# Patient Record
Sex: Female | Born: 1950
Health system: Southern US, Community
[De-identification: ages and names within clinical notes are randomized; demographics above are authoritative.]

## PROBLEM LIST (undated history)

## (undated) DIAGNOSIS — K219 Gastro-esophageal reflux disease without esophagitis: Secondary | ICD-10-CM

## (undated) DIAGNOSIS — K5900 Constipation, unspecified: Secondary | ICD-10-CM

## (undated) DIAGNOSIS — I509 Heart failure, unspecified: Secondary | ICD-10-CM

## (undated) NOTE — *Deleted (*Deleted)
***In Progress*** PCP: Hoy Register, MD HF Cardiology: Dr. Shirlee Latch  HPI:  52 y.o. with chronic systolic CHF was referred by Dr. [name] for evaluation of CHF.  Patient had no known cardiac history prior to 3/21.  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  Several weeks prior to 3/21 admission, she began to note exertional dyspnea.  This worsened to shortness of breath with minimal exertion and severe orthopnea.  She was admitted in 3/21 with dyspnea. CXR showed pulmonary edema and echo showed EF <20% with moderate LV dilation.  Low BP limited medication titration.  RHC/LHC showed no coronary disease, preserved cardiac output.  She was discharged home.    She recently returned for followup of CHF with Dr. Shirlee Latch on 06/06/20.  She was able to walk a mile without dyspnea.  Reported mild lightheadedness right after she took medications, but this was not something that bothered her.  Still seemed to have some orthopnea and kept the head of bed raised.  Weight was up several lbs.  No chest pain.  Today she returns to HF clinic for pharmacist medication titration. At last visit with MD, furosemide was decreased to 40 mg daily and Entresto was increased to 49/51 mg BID. Since then, she broke out into hives with the increased dose of Entresto, so she was instructed to hold for 3 days and to decrease back to 24/26 mg BID strength.  Won't check BMET since she was stable on the lower dose - Decreased Entresto back to 24/26 but kept furosemide 40/d > assess volume - Was on Corlanor before but was stopped to incr metop (HR 54)  Overall feeling ***. Dizziness, lightheadedness, fatigue:  Chest pain or palpitations:  How is your breathing?: *** SOB: Able to complete all ADLs. Activity level ***  Weight at home pounds. Takes furosemide/torsemide/bumex *** mg *** daily.  LEE PND/Orthopnea  Appetite *** Low-salt diet:   Physical Exam Cost/affordability of  meds   . Shortness of breath/dyspnea on exertion? {YES [id number]  . Orthopnea/PND? {YES [id number] . Edema? {YES [id number] . Lightheadedness/dizziness? {YES [id number] . Daily weights at home? {YES [id number] . Blood pressure/heart rate monitoring at home? {YES [id number] . Following low-sodium/fluid-restricted diet? {YES NO:22349}  HF Medications: Metoprolol succinate 25 mg AM/50 mg PM Entresto 24/26 mg BID Spironolactone 25 mg daily Farxiga (dapagliflozin) 10 mg daily Furosemide 40 mg daily Potassium Chloride 10 mEq daily  Has the patient been experiencing any side effects to the medications prescribed?  {YES NO:22349}  Does the patient have any problems obtaining medications due to transportation or finances?   No - Receives PANF [employer] PAP for [employer] of regimen: {excellent/good/fair/poor:19665} Understanding of indications: {excellent/good/fair/poor:19665} Potential of compliance: {excellent/good/fair/poor:19665} Patient understands to avoid NSAIDs. Patient understands to avoid decongestants.    Pertinent Lab Values: . Serum creatinine ***, BUN ***, Potassium ***, Sodium ***, BNP ***, Magnesium ***, Digoxin ***   Vital Signs: . Weight: *** (last clinic weight: ***) . Blood pressure: ***  . Heart rate: ***   Assessment: 1. Chronic systolic CHF: Nonischemic cardiomyopathy.  Echo in 3/21 with EF < 20%, moderate LV dilation.  LHC/RHC in 4/21 with no CAD, preserved cardiac output.  Etiology is uncertain, she does not use ETOH/drugs, no definite family history. Possible viral myocarditis. Medication titration has been limited by low BP and lightheadedness though this seems improved.  - NYHA class II symptoms; volume status looks ok. - Continue*** furosemide ***40 mg daily -  Continue metoprolol succinate 25 mg AM/50 mg PM - Continue Entresto 24/26 mg BID - Continue spironolactone 25 mg daily - Previously planned to get cardiac MRI to assess for  infiltrative disease/myocarditis (still need to schedule).   - If EF remains < 35%, will need to consider ICD. QRS not wide, not CRT candidate.   2. Pericardial effusion: Small-moderate pericardial effusion on 3/21 echo, no tamponade.  Will reassess on cardiac MRI.    Plan: 1) Medication changes: Based on clinical presentation, vital signs and recent labs will *** 2) Labs: *** 3) Follow-up: ***   Karle Plumber, PharmD, BCPS, BCCP, CPP Heart Failure Clinic Pharmacist [phone]

---

## 2004-12-29 ENCOUNTER — Emergency Department (HOSPITAL_COMMUNITY): Admission: EM | Admit: 2004-12-29 | Discharge: 2004-12-30 | Payer: Self-pay | Admitting: Emergency Medicine

## 2006-08-19 ENCOUNTER — Emergency Department (HOSPITAL_COMMUNITY): Admission: EM | Admit: 2006-08-19 | Discharge: 2006-08-19 | Payer: Self-pay | Admitting: Emergency Medicine

## 2007-09-16 ENCOUNTER — Emergency Department (HOSPITAL_COMMUNITY): Admission: EM | Admit: 2007-09-16 | Discharge: 2007-09-16 | Payer: Self-pay | Admitting: Emergency Medicine

## 2007-12-23 ENCOUNTER — Encounter: Admission: RE | Admit: 2007-12-23 | Discharge: 2008-03-16 | Payer: Self-pay | Admitting: Orthopedic Surgery

## 2009-04-08 ENCOUNTER — Emergency Department (HOSPITAL_COMMUNITY): Admission: EM | Admit: 2009-04-08 | Discharge: 2009-04-08 | Payer: Self-pay | Admitting: Emergency Medicine

## 2011-09-28 ENCOUNTER — Emergency Department (HOSPITAL_COMMUNITY): Payer: Self-pay

## 2011-09-28 ENCOUNTER — Emergency Department (HOSPITAL_COMMUNITY)
Admission: EM | Admit: 2011-09-28 | Discharge: 2011-09-28 | Disposition: A | Discharge status: Home / Self Care | Payer: Self-pay | Attending: Emergency Medicine | Admitting: Emergency Medicine

## 2011-09-28 ENCOUNTER — Encounter (HOSPITAL_COMMUNITY): Payer: Self-pay | Admitting: Emergency Medicine

## 2011-09-28 ENCOUNTER — Other Ambulatory Visit: Payer: Self-pay

## 2011-09-28 DIAGNOSIS — X58XXXA Exposure to other specified factors, initial encounter: Secondary | ICD-10-CM | POA: Insufficient documentation

## 2011-09-28 DIAGNOSIS — R0789 Other chest pain: Secondary | ICD-10-CM

## 2011-09-28 DIAGNOSIS — IMO0002 Reserved for concepts with insufficient information to code with codable children: Secondary | ICD-10-CM | POA: Insufficient documentation

## 2011-09-28 DIAGNOSIS — R110 Nausea: Secondary | ICD-10-CM | POA: Insufficient documentation

## 2011-09-28 DIAGNOSIS — S86919A Strain of unspecified muscle(s) and tendon(s) at lower leg level, unspecified leg, initial encounter: Secondary | ICD-10-CM

## 2011-09-28 DIAGNOSIS — M79609 Pain in unspecified limb: Secondary | ICD-10-CM | POA: Insufficient documentation

## 2011-09-28 DIAGNOSIS — R079 Chest pain, unspecified: Secondary | ICD-10-CM | POA: Insufficient documentation

## 2011-09-28 LAB — CBC
HCT: 34.4 % — ABNORMAL LOW (ref 36.0–46.0)
MCV: 88.4 fL (ref 78.0–100.0)
Platelets: 298 10*3/uL (ref 150–400)
RDW: 14.7 % (ref 11.5–15.5)
WBC: 6 10*3/uL (ref 4.0–10.5)

## 2011-09-28 LAB — D-DIMER, QUANTITATIVE: D-Dimer, Quant: 0.34 ug/mL-FEU (ref 0.00–0.48)

## 2011-09-28 LAB — BASIC METABOLIC PANEL
Calcium: 9.4 mg/dL (ref 8.4–10.5)
Chloride: 104 mEq/L (ref 96–112)
Creatinine, Ser: 0.85 mg/dL (ref 0.50–1.10)
GFR calc non Af Amer: 73 mL/min — ABNORMAL LOW (ref >90)
Sodium: 139 mEq/L (ref 135–145)

## 2011-09-28 LAB — DIFFERENTIAL
Basophils Absolute: 0 10*3/uL (ref 0.0–0.1)
Lymphs Abs: 2.8 10*3/uL (ref 0.7–4.0)
Neutrophils Relative %: 44 % (ref 43–77)

## 2011-09-28 LAB — POCT I-STAT, CHEM 8
Chloride: 107 mEq/L (ref 96–112)
Potassium: 3.8 mEq/L (ref 3.5–5.1)

## 2011-09-28 LAB — POCT I-STAT TROPONIN I: Troponin i, poc: 0 ng/mL (ref 0.00–0.08)

## 2011-09-28 MED ORDER — ASPIRIN 81 MG PO CHEW
324.0000 mg | CHEWABLE_TABLET | Freq: Once | ORAL | Status: AC
  Administered 2011-09-28: 324 mg via ORAL
  Filled 2011-09-28: qty 4

## 2011-09-28 MED ORDER — IBUPROFEN 400 MG PO TABS: 400.0000 mg | ORAL_TABLET | Freq: Three times a day (TID) | ORAL | Status: AC | PRN

## 2011-09-28 MED ORDER — IOHEXOL 350 MG/ML SOLN
100.0000 mL | Freq: Once | INTRAVENOUS | Status: AC | PRN
  Administered 2011-09-28: 100 mL via INTRAVENOUS

## 2011-09-28 MED ORDER — SODIUM CHLORIDE 0.9 % IV SOLN
INTRAVENOUS | Status: DC
  Administered 2011-09-28: 02:00:00 via INTRAVENOUS

## 2011-09-28 NOTE — ED Notes (Signed)
Patient transported to CT 

## 2011-09-28 NOTE — ED Notes (Signed)
PT. REPORTS RIGHT SIDE CHEST PAIN FOR 2 DAYS WITH SLIGHT NAUSEA TODAY , DENIES SOB OR DIAPHORESIS.

## 2011-09-28 NOTE — ED Provider Notes (Addendum)
History     CSN: [id number]  Arrival date & time 09/28/11  0054   First MD Initiated Contact with Patient 09/28/11 0128      Chief Complaint  Patient presents with  . Chest Pain    (Consider location/radiation/quality/duration/timing/severity/associated sxs/prior treatment) HPI Comments: The patient is a 61 year old female with no significant past medical history, a nonsmoker, not on hormone replacement therapy, with no prior thromboembolism, DVT or PE. She presents for evaluation of 2 days of right-sided chest discomfort which she is unable to describe the character of but rates it at a 3-4/10 in intensity, at 9/10 in intensity at its worst, without shortness of breath, diaphoresis, or vomiting, but some mild and fleeting nausea. The chest pain is nonradiating. The patient also complains of right leg pain at the posterior lower leg behind the knee and calf. She reports that this discomfort has been present for 2 days as well. She denies any palpitations, syncope, or near-syncope. She denies fever, chills, cough, or recent illness. She appears to be in no distress at present.  The history is provided by the patient.    History reviewed. No pertinent past medical history.  History reviewed. No pertinent past surgical history.  No family history on file.  History  Substance Use Topics  . Smoking status: Never Smoker   . Smokeless tobacco: Not on file  . Alcohol Use: No    OB History    Grav Para Term Preterm Abortions TAB SAB Ect Mult Living                  Review of Systems  Constitutional: Negative for fever, chills, diaphoresis and fatigue.  HENT: Negative for hearing loss, ear pain, congestion, sore throat, facial swelling, rhinorrhea, trouble swallowing, neck pain, neck stiffness, dental problem, postnasal drip, tinnitus and ear discharge.   Eyes: Negative for photophobia, pain, redness and visual disturbance.  Respiratory: Negative for apnea, cough, chest tightness,  shortness of breath and wheezing.   Cardiovascular: Positive for chest pain. Negative for palpitations and leg swelling.  Gastrointestinal: Negative for nausea, vomiting, abdominal pain, diarrhea and blood in stool.  Genitourinary: Negative.   Musculoskeletal: Negative for myalgias, back pain, joint swelling, arthralgias and gait problem.  Skin: Negative for color change, pallor, rash and wound.  Neurological: Negative for dizziness, tremors, seizures, syncope, facial asymmetry, speech difficulty, weakness, light-headedness, numbness and headaches.  Hematological: Negative for adenopathy. Does not bruise/bleed easily.  Psychiatric/Behavioral: Negative.     Allergies  Review of patient's allergies indicates no known allergies.  Home Medications   Current Outpatient Rx  Name Route Sig Dispense Refill  . ASPIRIN 325 MG PO TBEC Oral Take 325 mg by mouth daily.      BP 131/86  Pulse 88  Temp(Src) 98.9 F (37.2 C) (Oral)  Resp 20  SpO2 96%  Physical Exam  Nursing note and vitals reviewed. Constitutional: She is oriented to person, place, and time. She appears well-developed and well-nourished. No distress.  HENT:  Head: Normocephalic and atraumatic.  Mouth/Throat: Oropharynx is clear and moist.  Eyes: EOM are normal. Pupils are equal, round, and reactive to light.  Neck: Normal range of motion. Neck supple. No JVD present. No tracheal deviation present.  Cardiovascular: Normal rate, regular rhythm, normal heart sounds and intact distal pulses.   No extrasystoles are present. Exam reveals no gallop, no S3, no S4 and no friction rub.   No murmur heard. Pulmonary/Chest: Breath sounds normal. No accessory muscle usage or stridor.  Not tachypneic. No respiratory distress. She has no wheezes. She has no rales. She exhibits no tenderness.  Abdominal: Soft. Bowel sounds are normal. She exhibits no distension. There is no tenderness.  Musculoskeletal: Normal range of motion. She exhibits no  edema and no tenderness.       The patient has no swelling or palpable cord about the right lower extremity, and is not tender to palpation at the posterior calf or behind the right knee where she reports her discomfort.  Neurological: She is alert and oriented to person, place, and time. She has normal reflexes. No cranial nerve deficit. Coordination normal.  Skin: Skin is warm and dry. No rash noted. She is not diaphoretic. No erythema. No pallor.  Psychiatric: She has a normal mood and affect. Her behavior is normal. Judgment and thought content normal.    ED Course  Procedures (including critical care time)  Labs Reviewed  CBC - Abnormal; Notable for the following:    Hemoglobin 11.2 (*)    HCT 34.4 (*)    All other components within normal limits  BASIC METABOLIC PANEL  I-STAT TROPONIN I  D-DIMER, QUANTITATIVE  DIFFERENTIAL  I-STAT TROPONIN I   Dg Chest 2 View  09/28/2011  *RADIOLOGY REPORT*  Clinical Data: Chest pain and left arm pain; right lower leg pain.  CHEST - 2 VIEW  Comparison: Chest radiograph performed 12/30/2004  Findings: The lungs are well-aerated and clear.  There is no evidence of focal opacification, pleural effusion or pneumothorax. Pulmonary vascularity is at the upper limits of normal.  The heart is normal in size; the mediastinal contour is within normal limits.  No acute osseous abnormalities are seen.  Lateral osteophytes are noted along the thoracic spine.  IMPRESSION: No acute cardiopulmonary process seen.  Original Report Authenticated By: Tonia Ghent, M.D.     No diagnosis found.    MDM  Musculoskeletal chest pain, costochondritis, GERD, Gastrointestinal Chest Pain, Pleuritic Chest Pain, Pneumonia, Pneumothorax, Pulmonary Embolism, Esophageal Spasm, Arrhythmia, DVT considered among other potential etiologies in the patient's differential diagnosis. The patient is at low risk for DVT or pulmonary embolism and is a candidate for a rule out of this  diagnosis using a d-dimer if it is negative. Otherwise, she will need evaluation with CT angiogram of the chest as well as lower extremity Doppler ultrasound on the right lower extremity to exclude thromboembolic phenomenon. It is not my impression that this symptomatic complex represents ACS, or acute myocardial infarction as it would be atypical, however I will assess an EKG and a single troponin, as a single troponin, if negative with symptoms ongoing for 2 days, would exclude myocardial ischemia or injury. Chest x-ray appears to show no acute abnormality.    2:57 AM D-dimer is negative making the patient at very low risk of DVT or PE. However, based on my clinical suspicion, I will obtain a CT angio chest to fully exclude the diagnosis of PE.   Felisa Bonier, MD 09/28/11 0144  Felisa Bonier, MD 09/28/11 1610  Felisa Bonier, MD 09/28/11 0300  Felisa Bonier, MD 09/28/11 [phone]

## 2011-09-28 NOTE — ED Notes (Signed)
Family at bedside. 

## 2011-09-28 NOTE — ED Notes (Signed)
Reports interm substernal cp x5 days; presents tonight for same as well as RLE pain just distal to gap of rgt knee; states recent 2 hour trip one way on 3 separate occasions

## 2013-10-09 ENCOUNTER — Emergency Department (HOSPITAL_COMMUNITY)

## 2013-10-09 ENCOUNTER — Emergency Department (HOSPITAL_COMMUNITY)
Admission: EM | Admit: 2013-10-09 | Discharge: 2013-10-09 | Disposition: A | Discharge status: Home / Self Care | Attending: Emergency Medicine | Admitting: Emergency Medicine

## 2013-10-09 ENCOUNTER — Encounter (HOSPITAL_COMMUNITY): Payer: Self-pay | Admitting: Emergency Medicine

## 2013-10-09 DIAGNOSIS — M25439 Effusion, unspecified wrist: Secondary | ICD-10-CM | POA: Diagnosis not present

## 2013-10-09 DIAGNOSIS — Z7982 Long term (current) use of aspirin: Secondary | ICD-10-CM | POA: Diagnosis not present

## 2013-10-09 DIAGNOSIS — M25539 Pain in unspecified wrist: Secondary | ICD-10-CM | POA: Insufficient documentation

## 2013-10-09 DIAGNOSIS — Z87828 Personal history of other (healed) physical injury and trauma: Secondary | ICD-10-CM | POA: Insufficient documentation

## 2013-10-09 DIAGNOSIS — M1990 Unspecified osteoarthritis, unspecified site: Secondary | ICD-10-CM

## 2013-10-09 DIAGNOSIS — M25531 Pain in right wrist: Secondary | ICD-10-CM

## 2013-10-09 MED ORDER — NAPROXEN 250 MG PO TABS
500.0000 mg | ORAL_TABLET | Freq: Once | ORAL | Status: AC
  Administered 2013-10-09: 500 mg via ORAL
  Filled 2013-10-09: qty 2

## 2013-10-09 MED ORDER — NAPROXEN 500 MG PO TABS: 500.0000 mg | ORAL_TABLET | Freq: Two times a day (BID) | ORAL | Status: DC

## 2013-10-09 NOTE — ED Provider Notes (Signed)
Medical screening examination/treatment/procedure(s) were performed by non-physician practitioner and as supervising physician I was immediately available for consultation/collaboration.  EKG Interpretation   None         Robin Patel N Orlandria Kissner, DO 10/09/13 1453 
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
## 2013-10-09 NOTE — Discharge Instructions (Signed)
Take naproxen as directed.  Osteoarthritis Osteoarthritis is a disease that causes soreness and swelling (inflammation) of a joint. It occurs when the cartilage at the affected joint wears down. Cartilage acts as a cushion, covering the ends of bones where they meet to form a joint. Osteoarthritis is the most common form of arthritis. It often occurs in older people. The joints affected most often by this condition include those in the:  Ends of the fingers.  Thumbs.  Neck.  Lower back.  Knees.  Hips. CAUSES  Over time, the cartilage that covers the ends of bones begins to wear away. This causes bone to rub on bone, producing pain and stiffness in the affected joints.  RISK FACTORS Certain factors can increase your chances of having osteoarthritis, including:  Older age.  Excessive body weight.  Overuse of joints. SIGNS AND SYMPTOMS   Pain, swelling, and stiffness in the joint.  Over time, the joint may lose its normal shape.  Small deposits of bone (osteophytes) may grow on the edges of the joint.  Bits of bone or cartilage can break off and float inside the joint space. This may cause more pain and damage. DIAGNOSIS  Your health care provider will do a physical exam and ask about your symptoms. Various tests may be ordered, such as:  X-rays of the affected joint.  An MRI scan.  Blood tests to rule out other types of arthritis.  Joint fluid tests. This involves using a needle to draw fluid from the joint and examining the fluid under a microscope. TREATMENT  Goals of treatment are to control pain and improve joint function. Treatment plans may include:  A prescribed exercise program that allows for rest and joint relief.  A weight control plan.  Pain relief techniques, such as:  Properly applied heat and cold.  Electric pulses delivered to nerve endings under the skin (transcutaneous electrical nerve stimulation, TENS).  Massage.  Certain nutritional  supplements.  Medicines to control pain, such as:  Acetaminophen.  Nonsteroidal anti-inflammatory drugs (NSAIDs), such as naproxen.  Narcotic or central-acting agents, such as tramadol.  Corticosteroids. These can be given orally or as an injection.  Surgery to reposition the bones and relieve pain (osteotomy) or to remove loose pieces of bone and cartilage. Joint replacement may be needed in advanced states of osteoarthritis. HOME CARE INSTRUCTIONS   Only take over-the-counter or prescription medicines as directed by your health care provider. Take all medicines exactly as instructed.  Maintain a healthy weight. Follow your health care provider's instructions for weight control. This may include dietary instructions.  Exercise as directed. Your health care provider can recommend specific types of exercise. These may include:  Strengthening exercises These are done to strengthen the muscles that support joints affected by arthritis. They can be performed with weights or with exercise bands to add resistance.  Aerobic activities These are exercises, such as brisk walking or low-impact aerobics, that get your heart pumping.  Range-of-motion activities These keep your joints limber.  Balance and agility exercises These help you maintain daily living skills.  Rest your affected joints as directed by your health care provider.  Follow up with your health care provider as directed. SEEK MEDICAL CARE IF:   Your skin turns red.  You develop a rash in addition to your joint pain.  You have worsening joint pain. SEEK IMMEDIATE MEDICAL CARE IF:  You have a significant loss of weight or appetite.  You have a fever along with joint or  muscle aches.  You have night sweats. FOR MORE INFORMATION  National Institute of Arthritis and Musculoskeletal and Skin Diseases: www.niams.http://www.myers.net/ [employer] on Aging: https://walker.com/ American College of Rheumatology:  www.rheumatology.org Document Released: 08/20/2005 Document Revised: 06/10/2013 Document Reviewed: 04/27/2013 [facility] Patient Information 2014 [place], [place].  Wrist Pain Wrist injuries are frequent in adults and children. A sprain is an injury to the ligaments that hold your bones together. A strain is an injury to muscle or muscle cord-like structures (tendons) from stretching or pulling. Generally, when wrists are moderately tender to touch following a fall or injury, a break in the bone (fracture) may be present. Most wrist sprains or strains are better in 3 to 5 days, but complete healing may take several weeks. HOME CARE INSTRUCTIONS   Put ice on the injured area.  Put ice in a plastic bag.  Place a towel between your skin and the bag.  Leave the ice on for 15-20 minutes, 03-04 times a day, for the first 2 days.  Keep your arm raised above the level of your heart whenever possible to reduce swelling and pain.  Rest the injured area for at least 48 hours or as directed by your caregiver.  If a splint or elastic bandage has been applied, use it for as long as directed by your caregiver or until seen by a caregiver for a follow-up exam.  Only take over-the-counter or prescription medicines for pain, discomfort, or fever as directed by your caregiver.  Keep all follow-up appointments. You may need to follow up with a specialist or have follow-up X-rays. Improvement in pain level is not a guarantee that you did not fracture a bone in your wrist. The only way to determine whether or not you have a broken bone is by X-ray. SEEK IMMEDIATE MEDICAL CARE IF:   Your fingers are swollen, very red, white, or cold and blue.  Your fingers are numb or tingling.  You have increasing pain.  You have difficulty moving your fingers. MAKE SURE YOU:   Understand these instructions.  Will watch your condition.  Will get help right away if you are not doing well or get worse. Document  Released: 05/30/2005 Document Revised: 11/12/2011 Document Reviewed: 10/11/2010 [facility] Patient Information 2014 [employer], [place].

## 2013-10-09 NOTE — ED Notes (Signed)
C/o right wrist pain and swelling x 2 weeks. No known injury. States it "hurts to lift".

## 2017-10-07 ENCOUNTER — Encounter (HOSPITAL_COMMUNITY): Payer: Self-pay | Admitting: Emergency Medicine

## 2017-10-07 ENCOUNTER — Other Ambulatory Visit: Payer: Self-pay

## 2017-10-07 DIAGNOSIS — Z79899 Other long term (current) drug therapy: Secondary | ICD-10-CM | POA: Insufficient documentation

## 2017-10-07 DIAGNOSIS — R110 Nausea: Secondary | ICD-10-CM | POA: Diagnosis not present

## 2017-10-07 DIAGNOSIS — Z7982 Long term (current) use of aspirin: Secondary | ICD-10-CM | POA: Insufficient documentation

## 2017-10-07 LAB — COMPREHENSIVE METABOLIC PANEL
ALBUMIN: 3.3 g/dL — AB (ref 3.5–5.0)
ALK PHOS: 81 U/L (ref 38–126)
ALT: 24 U/L (ref 14–54)
AST: 21 U/L (ref 15–41)
Anion gap: 11 (ref 5–15)
BUN: 15 mg/dL (ref 6–20)
CALCIUM: 9 mg/dL (ref 8.9–10.3)
CO2: 24 mmol/L (ref 22–32)
CREATININE: 0.59 mg/dL (ref 0.44–1.00)
Chloride: 104 mmol/L (ref 101–111)
GFR calc Af Amer: >60 mL/min (ref >60)
GFR calc non Af Amer: >60 mL/min (ref >60)
GLUCOSE: 98 mg/dL (ref 65–99)
Potassium: 3.8 mmol/L (ref 3.5–5.1)
SODIUM: 139 mmol/L (ref 135–145)
Total Bilirubin: 0.4 mg/dL (ref 0.3–1.2)
Total Protein: 7.7 g/dL (ref 6.5–8.1)

## 2017-10-07 LAB — CBC
HCT: 36 % (ref 36.0–46.0)
Hemoglobin: 11.9 g/dL — ABNORMAL LOW (ref 12.0–15.0)
MCH: 30 pg (ref 26.0–34.0)
MCHC: 33.1 g/dL (ref 30.0–36.0)
MCV: 90.7 fL (ref 78.0–100.0)
PLATELETS: 333 10*3/uL (ref 150–400)
RBC: 3.97 MIL/uL (ref 3.87–5.11)
RDW: 14.9 % (ref 11.5–15.5)
WBC: 4.7 10*3/uL (ref 4.0–10.5)

## 2017-10-07 LAB — LIPASE, BLOOD: Lipase: 28 U/L (ref 11–51)

## 2017-10-07 MED ORDER — ONDANSETRON 4 MG PO TBDP
ORAL_TABLET | ORAL | Status: AC
  Filled 2017-10-07: qty 1

## 2017-10-07 MED ORDER — ONDANSETRON 4 MG PO TBDP
4.0000 mg | ORAL_TABLET | Freq: Once | ORAL | Status: AC
  Administered 2017-10-07: 4 mg via ORAL

## 2017-10-07 NOTE — ED Triage Notes (Signed)
Pt states she got some food from [employer] [place] 4 days ago and she is been nauseated since then no able to eat nothing. Pt denies any vomiting states she is on Mirolax since then trying to get better, but not relief.

## 2017-10-08 ENCOUNTER — Emergency Department (HOSPITAL_COMMUNITY)
Admission: EM | Admit: 2017-10-08 | Discharge: 2017-10-08 | Disposition: A | Discharge status: Home / Self Care | Payer: Medicare Other | Attending: Emergency Medicine | Admitting: Emergency Medicine

## 2017-10-08 DIAGNOSIS — R110 Nausea: Secondary | ICD-10-CM

## 2017-10-08 MED ORDER — ONDANSETRON 4 MG PO TBDP: 4.0000 mg | ORAL_TABLET | Freq: Three times a day (TID) | ORAL | 0 refills | Status: DC | PRN

## 2017-10-08 NOTE — ED Provider Notes (Signed)
[facility] EMERGENCY DEPARTMENT Provider Note   CSN: [id number] Arrival date & time: 10/07/17  1854     History   Chief Complaint Chief Complaint  Patient presents with  . Nausea    HPI Donnamae Kepler is a 67 y.o. female without significant PMHx, presenting to ED with 4 days of intermittent nausea. Pt states she ate [employer] 4 days ago, and began having left sided abdominal pain at the time with nausea. States abdominal pain resolved, however nausea has persisted and caused decreased appetite. She has been treating sx with miralax without relief. Denies vomiting, D/C, F/C, urinary sx, or other complaints. States zofran given in triage significantly improved sx.  The history is provided by the patient.    History reviewed. No pertinent past medical history.  There are no active problems to display for this patient.   History reviewed. No pertinent surgical history.  OB History    No data available       Home Medications    Prior to Admission medications   Medication Sig Start Date End Date Taking? Authorizing Provider  aspirin 325 MG EC tablet Take 325 mg by mouth daily.    [provider]  naproxen (NAPROSYN) 500 MG tablet Take 1 tablet (500 mg total) by mouth 2 (two) times daily. 10/09/13   Hess, Nada Boozer, PA-C  naproxen sodium (ANAPROX) 220 MG tablet Take 220 mg by mouth 2 (two) times daily as needed (pain).    [provider]  ondansetron (ZOFRAN ODT) 4 MG disintegrating tablet Take 1 tablet (4 mg total) by mouth every 8 (eight) hours as needed for nausea or vomiting. 10/08/17   Karmel Patricelli, [place] N, PA-C    Family History No family history on file.  Social History Social History   Tobacco Use  . Smoking status: Never Smoker  . Smokeless tobacco: Never Used  Substance Use Topics  . Alcohol use: No  . Drug use: No     Allergies   Patient has no known allergies.   Review of Systems Review of Systems  Constitutional:  Positive for appetite change. Negative for chills and fever.  Gastrointestinal: Positive for abdominal pain (resolved) and nausea. Negative for constipation, diarrhea and vomiting.  Genitourinary: Negative for dysuria and frequency.  All other systems reviewed and are negative.    Physical Exam Updated Vital Signs BP 137/78   Pulse 78   Temp 98 F (36.7 C) (Oral)   Resp 18   Ht 5\' 3"  (1.6 m)   Wt 65.8 kg (145 lb)   SpO2 99%   BMI 25.69 kg/m   Physical Exam  Constitutional: She appears well-developed and well-nourished. No distress.  HENT:  Head: Normocephalic and atraumatic.  Mouth/Throat: Oropharynx is clear and moist.  Eyes: Conjunctivae are normal.  Cardiovascular: Normal rate, regular rhythm, normal heart sounds and intact distal pulses.  Pulmonary/Chest: Effort normal and breath sounds normal. No respiratory distress.  Abdominal: Soft. Bowel sounds are normal. She exhibits no distension and no mass. There is no tenderness. There is no rebound and no guarding.  Neurological: She is alert.  Skin: Skin is warm.  Psychiatric: She has a normal mood and affect. Her behavior is normal.  Nursing note and vitals reviewed.    ED Treatments / Results  Labs (all labs ordered are listed, but only abnormal results are displayed) Labs Reviewed  COMPREHENSIVE METABOLIC PANEL - Abnormal; Notable for the following components:      Result Value  Albumin 3.3 (*)    All other components within normal limits  CBC - Abnormal; Notable for the following components:   Hemoglobin 11.9 (*)    All other components within normal limits  LIPASE, BLOOD  URINALYSIS, ROUTINE W REFLEX MICROSCOPIC    EKG  EKG Interpretation None       Radiology No results found.  Procedures Procedures (including critical care time)  Medications Ordered in ED Medications  ondansetron (ZOFRAN-ODT) disintegrating tablet 4 mg (4 mg Oral Given 10/07/17 2016)     Initial Impression / Assessment and  Plan / ED Course  I have reviewed the triage vital signs and the nursing notes.  Pertinent labs & imaging results that were available during my care of the patient were reviewed by me and considered in my medical decision making (see chart for details).     Pt presenting with nausea x 4 days. Abd pain resolved on day 1. No vomiting, D/C, or other complaints. Pt reports significant improvement in nausea from zofran given in triage. Abd exam benign, nontender. Pt is well-appearing, no signs of dehydration. CBC, CMP, lipase unremarkable. VSS. Doubt acute intra-abdominal pathology at this time. Pt tolerating PO fluids and crackers prior to discharge. Pt safe for discharge w symptomatic management and PCP follow up.  Patient discussed with and seen by Dr. Preston Fleeting.  Discussed results, findings, treatment and follow up. Patient advised of return precautions. Patient verbalized understanding and agreed with plan.  Final Clinical Impressions(s) / ED Diagnoses   Final diagnoses:  Nausea    ED Discharge Orders        Ordered    ondansetron (ZOFRAN ODT) 4 MG disintegrating tablet  Every 8 hours PRN     10/08/17 0536       Angelique Chevalier, [place] N, PA-C 10/08/17 0600    Dione Booze, MD 10/08/17 [phone]

## 2017-10-08 NOTE — Discharge Instructions (Signed)
Please read instructions below. °Drink clear liquids until your stomach feels better. Then, slowly introduce bland foods into your diet as tolerated, such as bread, rice, apples, bananas. °You can take zofran every 8 hours as needed for nausea. °Follow up with your primary care if symptoms persist. °Return to the ER for severe abdominal pain, fever, uncontrollable vomiting, or new or concerning symptoms. ° °

## 2017-10-08 NOTE — ED Notes (Signed)
Pt given crackers and gingerale.

## 2017-10-18 ENCOUNTER — Ambulatory Visit: Attending: Internal Medicine | Admitting: Physician Assistant

## 2017-10-18 VITALS — BP 116/73 | HR 99 | Temp 97.6°F | Wt 164.8 lb

## 2017-10-18 DIAGNOSIS — Z79899 Other long term (current) drug therapy: Secondary | ICD-10-CM | POA: Diagnosis not present

## 2017-10-18 DIAGNOSIS — R110 Nausea: Secondary | ICD-10-CM | POA: Diagnosis not present

## 2017-10-18 DIAGNOSIS — Z7982 Long term (current) use of aspirin: Secondary | ICD-10-CM | POA: Insufficient documentation

## 2017-10-18 NOTE — Progress Notes (Signed)
Robin Patel  ZOX:[id number]  [id number]:[id number]  DOB - 1950-10-16  Chief Complaint  Patient presents with  . Follow-up    nausea       Subjective:   Robin Patel is a 67 y.o. female here today for establishment of care. She has no significant past medical history other than carpal tunnel release bilaterally. She went to the emergency department on 10/08/2017 with lower abdominal pain, decreased appetite and nausea. She was given Zofran with relief of symptoms. She also was given a prescription to go home with. Her vital signs were stable. No imaging was done. Her white blood cell count, lipase and BMP were all okay.  She's been taking some over-the-counter anti-emetics with good relief. She does have a lot of belching. I did offer her a PPI and she declines at this time. She feels that her symptoms overall have improved.  ROS: GEN: denies fever or chills, denies change in weight Skin: denies lesions or rashes HEENT: denies headache, earache, epistaxis, sore throat, or neck pain LUNGS: denies SHOB, dyspnea, PND, orthopnea CV: denies CP or palpitations ABD: denies abd pain, N or V EXT: denies muscle spasms or swelling; no pain in lower ext, no weakness NEURO: denies numbness or tingling, denies sz, stroke or TIA  ALLERGIES: No Known Allergies  PAST MEDICAL HISTORY: No past medical history on file.  PAST SURGICAL HISTORY: No past surgical history on file.  MEDICATIONS AT HOME: Prior to Admission medications   Medication Sig Start Date End Date Taking? Authorizing Provider  ondansetron (ZOFRAN ODT) 4 MG disintegrating tablet Take 1 tablet (4 mg total) by mouth every 8 (eight) hours as needed for nausea or vomiting. 10/08/17  Yes Robinson, [place] N, PA-C  aspirin 325 MG EC tablet Take 325 mg by mouth daily.    [provider]  naproxen (NAPROSYN) 500 MG tablet Take 1 tablet (500 mg total) by mouth 2 (two) times daily. Patient not taking: Reported on 10/18/2017 10/09/13    Kathrynn Speed, PA-C  naproxen sodium (ANAPROX) 220 MG tablet Take 220 mg by mouth 2 (two) times daily as needed (pain).    [provider]    Family-both parents had heart trouble  Social-married, children, retired  Objective:   Vitals:   10/18/17 1352  BP: 116/73  Pulse: 99  Temp: 97.6 F (36.4 C)  TempSrc: Oral  SpO2: 95%  Weight: 164 lb 12.8 oz (74.8 kg)    Exam General appearance : Awake, alert, not in any distress. Speech Clear. Not toxic looking Chest:Good air entry bilaterally, no added sounds  CVS: S1 S2 regular, no murmurs.  Abdomen: Bowel sounds present, Non tender and not distended with no guarding, rigidity or rebound. Extremities: B/L Lower Ext shows no edema, both legs are warm to touch Neurology: Awake alert, and oriented X 3, CN II-XII intact, Non focal Skin:No Rash Wounds:N/A   Assessment & Plan  1. Nausea-subsided  -cont prn antiemetics   Return in about 4 weeks (around 11/15/2017).Needs routine health maintenance.  The patient was given clear instructions to go to ER or return to medical center if symptoms don't improve, worsen or new problems develop. The patient verbalized understanding. The patient was told to call to get lab results if they haven't heard anything in the next week.   This note has been created with Education officer, environmental. Any transcriptional errors are unintentional.    [facility] [place],  [place] [phone]   10/18/2017, 1:59 PM

## 2017-11-27 ENCOUNTER — Encounter: Payer: Self-pay | Admitting: Family Medicine

## 2017-11-27 ENCOUNTER — Ambulatory Visit: Payer: Medicare Other | Attending: Family Medicine | Admitting: Family Medicine

## 2017-11-27 VITALS — BP 144/81 | HR 97 | Temp 98.1°F | Wt 159.2 lb

## 2017-11-27 DIAGNOSIS — M1990 Unspecified osteoarthritis, unspecified site: Secondary | ICD-10-CM | POA: Insufficient documentation

## 2017-11-27 DIAGNOSIS — R110 Nausea: Secondary | ICD-10-CM | POA: Diagnosis not present

## 2017-11-27 DIAGNOSIS — M25562 Pain in left knee: Secondary | ICD-10-CM

## 2017-11-27 DIAGNOSIS — G8929 Other chronic pain: Secondary | ICD-10-CM

## 2017-11-27 DIAGNOSIS — Z7982 Long term (current) use of aspirin: Secondary | ICD-10-CM | POA: Diagnosis not present

## 2017-11-27 DIAGNOSIS — R030 Elevated blood-pressure reading, without diagnosis of hypertension: Secondary | ICD-10-CM

## 2017-11-27 DIAGNOSIS — Z79899 Other long term (current) drug therapy: Secondary | ICD-10-CM | POA: Insufficient documentation

## 2017-11-27 DIAGNOSIS — Z791 Long term (current) use of non-steroidal anti-inflammatories (NSAID): Secondary | ICD-10-CM | POA: Diagnosis not present

## 2017-11-27 DIAGNOSIS — M25561 Pain in right knee: Secondary | ICD-10-CM | POA: Diagnosis not present

## 2017-11-27 NOTE — Progress Notes (Signed)
Subjective:  Patient ID: Robin Patel, female    DOB: Jun 06, 1951  Age: 67 y.o. MRN: [id number]  CC: Establish Care   HPI Robin Patel presents for follow-up visit after being seen by the PA last month for nausea and possible GERD had declined initiation of PPI.  She informs me her symptoms have improved and she no longer needs to take Zofran which helped her previously. Her blood pressure is slightly elevated today and she attributes this to being stressed prior to coming into the clinic this afternoon. She does have intermittent bilateral knee pain which worsens with the cold weather but is absent at this time. Denies additional concerns today.  History reviewed. No pertinent past medical history.  History reviewed. No pertinent surgical history.  No Known Allergies  Outpatient Medications Prior to Visit  Medication Sig Dispense Refill  . aspirin 325 MG EC tablet Take 325 mg by mouth daily.    . ondansetron (ZOFRAN ODT) 4 MG disintegrating tablet Take 1 tablet (4 mg total) by mouth every 8 (eight) hours as needed for nausea or vomiting. 20 tablet 0  . naproxen (NAPROSYN) 500 MG tablet Take 1 tablet (500 mg total) by mouth 2 (two) times daily. (Patient not taking: Reported on 10/18/2017) 30 tablet 0  . naproxen sodium (ANAPROX) 220 MG tablet Take 220 mg by mouth 2 (two) times daily as needed (pain).     No facility-administered medications prior to visit.     ROS Review of Systems  Constitutional: Negative for activity change, appetite change and fatigue.  HENT: Negative for congestion, sinus pressure and sore throat.   Eyes: Negative for visual disturbance.  Respiratory: Negative for cough, chest tightness, shortness of breath and wheezing.   Cardiovascular: Negative for chest pain and palpitations.  Gastrointestinal: Negative for abdominal distention, abdominal pain and constipation.  Endocrine: Negative for polydipsia.  Genitourinary: Negative for dysuria and frequency.    Musculoskeletal:       See hpi  Skin: Negative for rash.  Neurological: Negative for tremors, light-headedness and numbness.  Hematological: Does not bruise/bleed easily.  Psychiatric/Behavioral: Negative for agitation and behavioral problems.    Objective:  BP (!) 144/81   Pulse 97   Temp 98.1 F (36.7 C) (Oral)   Wt 159 lb 3.2 oz (72.2 kg)   SpO2 97%   BMI 28.20 kg/m   BP/Weight 11/27/2017 10/18/2017 10/08/2017  Systolic BP 144 116 137  Diastolic BP 81 73 78  Wt. (Lbs) 159.2 164.8 -  BMI 28.2 29.19 25.69     Physical Exam  Constitutional: She is oriented to person, place, and time. She appears well-developed and well-nourished.  Cardiovascular: Normal rate, normal heart sounds and intact distal pulses.  No murmur heard. Pulmonary/Chest: Effort normal and breath sounds normal. She has no wheezes. She has no rales. She exhibits no tenderness.  Abdominal: Soft. Bowel sounds are normal. She exhibits no distension and no mass. There is no tenderness.  Musculoskeletal: Normal range of motion. She exhibits no edema or tenderness.  Slight crepitus on range of motion  Neurological: She is alert and oriented to person, place, and time.  Psychiatric: She has a normal mood and affect.     Assessment & Plan:   1. Chronic pain of both knees The underlying osteoarthritis Advised to use OTC NSAIDs as needed  2. Elevated blood pressure reading No previous history of hypertension since last office visit revealed a blood pressure of 116/73  3. Nausea Improved   No orders of  the defined types were placed in this encounter.   Follow-up: Return if symptoms worsen or fail to improve.   Hoy Register MD

## 2019-04-02 ENCOUNTER — Other Ambulatory Visit: Payer: Self-pay

## 2019-11-13 ENCOUNTER — Ambulatory Visit: Attending: Internal Medicine

## 2019-11-13 DIAGNOSIS — Z23 Encounter for immunization: Secondary | ICD-10-CM

## 2019-11-13 NOTE — Progress Notes (Signed)
   Covid-19 Vaccination Clinic  Name:  Kathern Lobosco    MRN: [id number] DOB: 03/23/1951  11/13/2019  Ms. Kalka was observed post Covid-19 immunization for 15 minutes without incident. She was provided with Vaccine Information Sheet and instruction to access the V-Safe system.   Ms. Dinh was instructed to call 911 with any severe reactions post vaccine: Marland Kitchen Difficulty breathing  . Swelling of face and throat  . A fast heartbeat  . A bad rash all over body  . Dizziness and weakness   Immunizations Administered    Name Date Dose VIS Date Route   Pfizer COVID-19 Vaccine 11/13/2019  2:40 PM 0.3 mL 08/14/2019 Intramuscular   Manufacturer: [employer], [employer]   Lot: [id number]   NDC: [address]-2

## 2019-11-17 ENCOUNTER — Ambulatory Visit (HOSPITAL_COMMUNITY)
Admission: EM | Admit: 2019-11-17 | Discharge: 2019-11-17 | Disposition: A | Discharge status: Home / Self Care | Attending: Emergency Medicine | Admitting: Emergency Medicine

## 2019-11-17 ENCOUNTER — Other Ambulatory Visit: Payer: Self-pay

## 2019-11-17 ENCOUNTER — Encounter (HOSPITAL_COMMUNITY): Payer: Self-pay

## 2019-11-17 DIAGNOSIS — R141 Gas pain: Secondary | ICD-10-CM

## 2019-11-17 MED ORDER — FAMOTIDINE 20 MG PO TABS: 20.0000 mg | ORAL_TABLET | Freq: Two times a day (BID) | ORAL | 0 refills | Status: DC

## 2019-11-17 MED ORDER — SIMETHICONE 250 MG PO CAPS: 1.0000 | ORAL_CAPSULE | Freq: Two times a day (BID) | ORAL | 0 refills | Status: DC | PRN

## 2019-11-17 NOTE — Discharge Instructions (Addendum)
You can try drinking some warm fluids.  Continue the MiraLAX.  The simethicone that I am giving you is the same medicine that you already have, just a higher dose.  You only need to take it twice a day.  You can try it only if the over-the-counter 125 mg is not working for you.  Do not take more than 500 mg of simethicone from all sources in 1 day.  You can try exercise such as walking or squatting or lying down on your back and pulling her legs up to your belly.  This will help pass the gas.  Try some Pepcid as well.  Follow-up with a primary care physician of your choice see list below.  Go immediately to the ER if you get worse, have any symptoms similar to previous heart attack, or for other concerns Below is a list of primary care practices who are taking new patients for you to follow-up with.  [facility] internal medicine clinic Ground Floor - [facility], [address] [place], [place], [place] [address] [phone]  [facility] at [facility] [address] [place], [place] [address] [phone]  [facility] [address] [place], [place] [address] [phone]  [facility] [phone] [address] [place] [address]  [facility]: [address] [place] [address]  [phone]  [facility]: [address] [place] [address]   [phone]  [facility]: [address] [place] [address]  [phone]  [facility] : [address] [place] [address] [phone]  [facility] Primary Care: [address] [place] [address] [phone]  [facility]: [address] [place] [address] [phone]  Dr. Oneal Grout [address] [facility]  [place] [address]  [phone]  Dr. Jackie Plum, [facility]. [address] [place], [place] [address]  [phone]  Go to www.goodrx.com to look up your medications. This will give you a list of where you can find your prescriptions at the most affordable prices. Or ask the pharmacist what the cash price is, or if they have any other discount programs available to help make your medication more affordable. This can be less expensive than what you would pay with insurance.

## 2019-11-17 NOTE — ED Triage Notes (Signed)
Pt states she's had a lot of gas in her stomach and it's been causing pain in abdomen, she's been burping. Pt states this started 3 days ago. Pt states she had 3 small BMs today. She states miralax and gas X has not helped her at home.

## 2019-11-17 NOTE — ED Provider Notes (Signed)
HPI  SUBJECTIVE:  Robin Patel is a 69 y.o. female who presents with "gas" for the past 4 days.  She reports sharp or crampy epigastric/upper abdominal pain that lasts minutes.  States that it intermittently comes up through the center of her chest followed by belching and improvement in her symptoms.  She reports central chest pressure when the gas builds up.  This gets better after belching.  She denies nausea, vomiting, diaphoresis, radiation of this pain up her neck, down her arm, through to her back.  There is no exertional component.  Also reports bloating, constipation, flatulence.  No fevers, abdominal distention.  She has been taking MiraLAX once a day for the past 4 days with 3 bowel movements today.  States that this is helping with her gas.  Gas-X 125 mg this morning and took 2 tablets which have also helped her symptoms.  No change in her diet.  States that she has had a poor diet recently.  She has had symptoms like this before when she was constipated.  Symptoms are better with belching, having a bowel movement and worse with bending forward.  She has a past medical history of a "light heart attack" in 2006.  No history of hypertension, diabetes, abdominal surgeries, IBS, smoking, hypercholesterolemia, GERD, coronary artery disease.  PMD: None.   History reviewed. No pertinent past medical history.  Past Surgical History:  Procedure Laterality Date  . HAND SURGERY Bilateral   . TUBAL LIGATION      Family History  Problem Relation Age of Onset  . Heart failure Mother   . Hypertension Father   . Heart failure Father     Social History   Tobacco Use  . Smoking status: Never Smoker  . Smokeless tobacco: Never Used  Substance Use Topics  . Alcohol use: No  . Drug use: No    No current facility-administered medications for this encounter.  Current Outpatient Medications:  .  aspirin 325 MG EC tablet, Take 325 mg by mouth daily., Disp: , Rfl:  .  famotidine (PEPCID) 20 MG  tablet, Take 1 tablet (20 mg total) by mouth 2 (two) times daily., Disp: 60 tablet, Rfl: 0 .  Simethicone 250 MG CAPS, Take 1 capsule by mouth 2 (two) times daily as needed., Disp: 20 capsule, Rfl: 0  No Known Allergies   ROS  As noted in HPI.   Physical Exam  BP (!) 152/102   Pulse (!) 109   Temp 98.5 F (36.9 C) (Oral)   Resp 20   Ht 5\' 3"  (1.6 m)   Wt 70.3 kg   SpO2 98%   BMI 27.46 kg/m   Constitutional: Well developed, well nourished, no acute distress Eyes:  EOMI, conjunctiva normal bilaterally HENT: Normocephalic, atraumatic,mucus membranes moist Respiratory: Normal inspiratory effort lungs clear bilaterally Cardiovascular: Regular tachycardia no murmurs rubs or gallops GI: Normal appearance, soft, mild epigastric tenderness.  No guarding, rebound.  Nondistended.  Active bowel sounds. skin: No rash, skin intact Musculoskeletal: no deformities Neurologic: Alert & oriented x 3, no focal neuro deficits Psychiatric: Speech and behavior appropriate   ED Course   Medications - No data to display  Orders Placed This Encounter  Procedures  . ED EKG    Epigastric pain    Standing Status:   Standing    Number of Occurrences:   1    Order Specific Question:   Reason for Exam    Answer:   Other (See Comments)    No  results found for this or any previous visit (from the past 24 hour(s)). No results found.  ED Clinical Impression  1. Abdominal gas pain      ED Assessment/Plan  Presentation consistent with gas with constipation, resolving.  Patient states that she had 3 bowel movements today after taking MiraLAX for the past 4 days.  States that she felt better after that.  She started simethicone 125 mg today which she states is also making her feel better.  We will have her continue MiraLAX, simethicone.  Will advise walking around/exercise, warm liquids, Pepcid.  Will check an EKG given her history of MI with her current epigastric pain.  Will provide primary  care list for ongoing care.  To the ER if she gets worse.  EKG: Sinus tachycardia rate 106.  Left axis deviation present on previous EKG from 2013.  Left anterior fascicular block.  No hypertrophy.  No ST-T wave changes suggestive of ischemia.  Discussed  MDM, treatment plan, and plan for follow-up with patient. Discussed sn/sx that should prompt return to the ED. patient agrees with plan.   Meds ordered this encounter  Medications  . Simethicone 250 MG CAPS    Sig: Take 1 capsule by mouth 2 (two) times daily as needed.    Dispense:  20 capsule    Refill:  0  . famotidine (PEPCID) 20 MG tablet    Sig: Take 1 tablet (20 mg total) by mouth 2 (two) times daily.    Dispense:  60 tablet    Refill:  0    *This clinic note was created using Scientist, clinical (histocompatibility and immunogenetics). Therefore, there may be occasional mistakes despite careful proofreading.   ?    Domenick Gong, MD 11/17/19 [phone]

## 2019-12-01 ENCOUNTER — Encounter (HOSPITAL_COMMUNITY): Payer: Self-pay | Admitting: Emergency Medicine

## 2019-12-01 ENCOUNTER — Ambulatory Visit (HOSPITAL_COMMUNITY)
Admission: EM | Admit: 2019-12-01 | Discharge: 2019-12-01 | Payer: Medicare Other | Attending: Internal Medicine | Admitting: Internal Medicine

## 2019-12-01 ENCOUNTER — Ambulatory Visit (INDEPENDENT_AMBULATORY_CARE_PROVIDER_SITE_OTHER): Payer: Medicare Other

## 2019-12-01 ENCOUNTER — Other Ambulatory Visit: Payer: Self-pay

## 2019-12-01 ENCOUNTER — Inpatient Hospital Stay (HOSPITAL_COMMUNITY)
Admission: EM | Admit: 2019-12-01 | Discharge: 2019-12-05 | DRG: 286 | Disposition: A | Discharge status: Home / Self Care | Payer: Medicare Other | Attending: Internal Medicine | Admitting: Internal Medicine

## 2019-12-01 DIAGNOSIS — R000 Tachycardia, unspecified: Secondary | ICD-10-CM | POA: Diagnosis not present

## 2019-12-01 DIAGNOSIS — Z7982 Long term (current) use of aspirin: Secondary | ICD-10-CM

## 2019-12-01 DIAGNOSIS — F408 Other phobic anxiety disorders: Secondary | ICD-10-CM | POA: Diagnosis present

## 2019-12-01 DIAGNOSIS — J90 Pleural effusion, not elsewhere classified: Secondary | ICD-10-CM | POA: Insufficient documentation

## 2019-12-01 DIAGNOSIS — Z8249 Family history of ischemic heart disease and other diseases of the circulatory system: Secondary | ICD-10-CM

## 2019-12-01 DIAGNOSIS — I5021 Acute systolic (congestive) heart failure: Secondary | ICD-10-CM | POA: Diagnosis not present

## 2019-12-01 DIAGNOSIS — M7989 Other specified soft tissue disorders: Secondary | ICD-10-CM | POA: Insufficient documentation

## 2019-12-01 DIAGNOSIS — R0601 Orthopnea: Secondary | ICD-10-CM | POA: Insufficient documentation

## 2019-12-01 DIAGNOSIS — K5900 Constipation, unspecified: Secondary | ICD-10-CM | POA: Insufficient documentation

## 2019-12-01 DIAGNOSIS — I509 Heart failure, unspecified: Secondary | ICD-10-CM

## 2019-12-01 DIAGNOSIS — I313 Pericardial effusion (noninflammatory): Secondary | ICD-10-CM | POA: Diagnosis present

## 2019-12-01 DIAGNOSIS — K219 Gastro-esophageal reflux disease without esophagitis: Secondary | ICD-10-CM | POA: Diagnosis present

## 2019-12-01 DIAGNOSIS — R0602 Shortness of breath: Secondary | ICD-10-CM

## 2019-12-01 DIAGNOSIS — K761 Chronic passive congestion of liver: Secondary | ICD-10-CM | POA: Diagnosis present

## 2019-12-01 DIAGNOSIS — Z20822 Contact with and (suspected) exposure to covid-19: Secondary | ICD-10-CM | POA: Insufficient documentation

## 2019-12-01 DIAGNOSIS — R0600 Dyspnea, unspecified: Secondary | ICD-10-CM

## 2019-12-01 DIAGNOSIS — Z79899 Other long term (current) drug therapy: Secondary | ICD-10-CM | POA: Insufficient documentation

## 2019-12-01 DIAGNOSIS — I428 Other cardiomyopathies: Secondary | ICD-10-CM | POA: Diagnosis present

## 2019-12-01 DIAGNOSIS — I272 Pulmonary hypertension, unspecified: Secondary | ICD-10-CM | POA: Diagnosis present

## 2019-12-01 DIAGNOSIS — R7989 Other specified abnormal findings of blood chemistry: Secondary | ICD-10-CM

## 2019-12-01 DIAGNOSIS — F22 Delusional disorders: Secondary | ICD-10-CM | POA: Diagnosis not present

## 2019-12-01 DIAGNOSIS — E8770 Fluid overload, unspecified: Secondary | ICD-10-CM | POA: Diagnosis present

## 2019-12-01 DIAGNOSIS — R05 Cough: Secondary | ICD-10-CM | POA: Insufficient documentation

## 2019-12-01 DIAGNOSIS — R7401 Elevation of levels of liver transaminase levels: Secondary | ICD-10-CM | POA: Diagnosis present

## 2019-12-01 DIAGNOSIS — R140 Abdominal distension (gaseous): Secondary | ICD-10-CM | POA: Diagnosis present

## 2019-12-01 DIAGNOSIS — R162 Hepatomegaly with splenomegaly, not elsewhere classified: Secondary | ICD-10-CM | POA: Diagnosis present

## 2019-12-01 DIAGNOSIS — J9811 Atelectasis: Secondary | ICD-10-CM | POA: Diagnosis present

## 2019-12-01 DIAGNOSIS — R109 Unspecified abdominal pain: Secondary | ICD-10-CM

## 2019-12-01 DIAGNOSIS — J9601 Acute respiratory failure with hypoxia: Secondary | ICD-10-CM | POA: Diagnosis present

## 2019-12-01 HISTORY — DX: Constipation, unspecified: K59.00

## 2019-12-01 LAB — CBC WITH DIFFERENTIAL/PLATELET
Abs Immature Granulocytes: 0 10*3/uL (ref 0.00–0.07)
Basophils Absolute: 0 10*3/uL (ref 0.0–0.1)
Basophils Relative: 1 %
Eosinophils Absolute: 0.1 10*3/uL (ref 0.0–0.5)
Eosinophils Relative: 2 %
HCT: 39.1 % (ref 36.0–46.0)
Hemoglobin: 12.6 g/dL (ref 12.0–15.0)
Immature Granulocytes: 0 %
Lymphocytes Relative: 43 %
Lymphs Abs: 1.9 10*3/uL (ref 0.7–4.0)
MCH: 31 pg (ref 26.0–34.0)
MCHC: 32.2 g/dL (ref 30.0–36.0)
MCV: 96.1 fL (ref 80.0–100.0)
Monocytes Absolute: 0.5 10*3/uL (ref 0.1–1.0)
Monocytes Relative: 11 %
Neutro Abs: 1.9 10*3/uL (ref 1.7–7.7)
Neutrophils Relative %: 43 %
Platelets: 272 10*3/uL (ref 150–400)
RBC: 4.07 MIL/uL (ref 3.87–5.11)
RDW: 18 % — ABNORMAL HIGH (ref 11.5–15.5)
WBC: 4.4 10*3/uL (ref 4.0–10.5)
nRBC: 0 % (ref 0.0–0.2)

## 2019-12-01 LAB — COMPREHENSIVE METABOLIC PANEL
ALT: 153 U/L — ABNORMAL HIGH (ref 0–44)
AST: 132 U/L — ABNORMAL HIGH (ref 15–41)
Albumin: 2.9 g/dL — ABNORMAL LOW (ref 3.5–5.0)
Alkaline Phosphatase: 127 U/L — ABNORMAL HIGH (ref 38–126)
Anion gap: 11 (ref 5–15)
BUN: 16 mg/dL (ref 8–23)
CO2: 22 mmol/L (ref 22–32)
Calcium: 8.7 mg/dL — ABNORMAL LOW (ref 8.9–10.3)
Chloride: 108 mmol/L (ref 98–111)
Creatinine, Ser: 0.65 mg/dL (ref 0.44–1.00)
GFR calc Af Amer: >60 mL/min (ref >60)
GFR calc non Af Amer: >60 mL/min (ref >60)
Glucose, Bld: 134 mg/dL — ABNORMAL HIGH (ref 70–99)
Potassium: 3.8 mmol/L (ref 3.5–5.1)
Sodium: 141 mmol/L (ref 135–145)
Total Bilirubin: 0.6 mg/dL (ref 0.3–1.2)
Total Protein: 6 g/dL — ABNORMAL LOW (ref 6.5–8.1)

## 2019-12-01 LAB — BRAIN NATRIURETIC PEPTIDE: B Natriuretic Peptide: 848.7 pg/mL — ABNORMAL HIGH (ref 0.0–100.0)

## 2019-12-01 NOTE — ED Notes (Signed)
Patient is being discharged from the Urgent Care Center and sent to the Emergency Department via personal vehicle by spouse. Per Dr Leonides Grills, patient is stable but in need of higher level of care due to cardiac issues. Patient is aware and verbalizes understanding of plan of care.   Vitals:   12/01/19 1423  BP: 140/83  Pulse: (!) 110  Resp: (!) 36  Temp: 98.6 F (37 C)  SpO2: 100%

## 2019-12-01 NOTE — ED Triage Notes (Signed)
Pt here for increased SOB x 1 week and some swelling in her feet x 4 days; pt denies hx of same; pt noted to have labored breathing

## 2019-12-01 NOTE — ED Triage Notes (Signed)
Pt SOB for a week and constipation. Sent from UC due to fluid on her lungs. BNP 848.

## 2019-12-01 NOTE — ED Provider Notes (Signed)
[place]    CSN: [id number] Arrival date & time: 12/01/19  1357      History   Chief Complaint Chief Complaint  Patient presents with  . Shortness of Breath    HPI Robin Patel is a 69 y.o. female comes to urgent care with complaints of worsening shortness of breath, orthopnea and paroxysmal nocturnal dyspnea as well as lower extremity swelling of 1 week duration.  Patient symptoms started insidiously and is gotten progressively worse.  She denies any chest pain or chest pressure.  She has cough which is productive of frothy sputum.  No dizziness, near syncope or syncopal episode.  No fever or chills.  She has constipation.  She was recently treated for constipation in the urgent care. HPI  History reviewed. No pertinent past medical history.  There are no problems to display for this patient.   Past Surgical History:  Procedure Laterality Date  . HAND SURGERY Bilateral   . TUBAL LIGATION      OB History   No obstetric history on file.      Home Medications    Prior to Admission medications   Medication Sig Start Date End Date Taking? Authorizing Provider  aspirin 325 MG EC tablet Take 325 mg by mouth daily.    [provider]  famotidine (PEPCID) 20 MG tablet Take 1 tablet (20 mg total) by mouth 2 (two) times daily. 11/17/19 12/17/19  Melynda Ripple, MD  Simethicone 250 MG CAPS Take 1 capsule by mouth 2 (two) times daily as needed. 11/17/19   Melynda Ripple, MD    Family History Family History  Problem Relation Age of Onset  . Heart failure Mother   . Hypertension Father   . Heart failure Father     Social History Social History   Tobacco Use  . Smoking status: Never Smoker  . Smokeless tobacco: Never Used  Substance Use Topics  . Alcohol use: No  . Drug use: No     Allergies   Patient has no known allergies.   Review of Systems Review of Systems  Constitutional: Negative for activity change, chills, fatigue and  fever.  HENT: Negative.   Respiratory: Positive for cough and shortness of breath. Negative for wheezing and stridor.   Cardiovascular: Positive for chest pain. Negative for palpitations.  Gastrointestinal: Negative.  Negative for abdominal distention, nausea and vomiting.  Genitourinary: Negative.   Musculoskeletal: Negative for arthralgias, back pain and joint swelling.  Skin: Negative.   Neurological: Negative.  Negative for dizziness, syncope and headaches.     Physical Exam Triage Vital Signs ED Triage Vitals  Enc Vitals Group     BP 12/01/19 1423 140/83     Pulse Rate 12/01/19 1423 (!) 110     Resp 12/01/19 1423 (!) 36     Temp 12/01/19 1423 98.6 F (37 C)     Temp Source 12/01/19 1423 Oral     SpO2 12/01/19 1423 100 %     Weight --      Height --      Head Circumference --      Peak Flow --      Pain Score 12/01/19 1424 0     Pain Loc --      Pain Edu? --      Excl. in [place]? --    No data found.  Updated Vital Signs BP 140/83 (BP Location: Right Arm)   Pulse (!) 110   Temp 98.6 F (37 C) (Oral)  Resp (!) 36   SpO2 100%   Visual Acuity Right Eye Distance:   Left Eye Distance:   Bilateral Distance:    Right Eye Near:   Left Eye Near:    Bilateral Near:     Physical Exam Vitals and nursing note reviewed.  Constitutional:      General: She is in acute distress.     Appearance: She is ill-appearing.  Cardiovascular:     Rate and Rhythm: Regular rhythm. Tachycardia present.  No extrasystoles are present.    Pulses: No decreased pulses.  Pulmonary:     Effort: Tachypnea and respiratory distress present. No accessory muscle usage.     Breath sounds: Examination of the right-lower field reveals decreased breath sounds. Examination of the left-lower field reveals decreased breath sounds. Decreased breath sounds and rales present. No wheezing or rhonchi.  Abdominal:     Palpations: Abdomen is soft. There is no hepatomegaly or splenomegaly.  Musculoskeletal:         General: Normal range of motion.     Cervical back: Normal range of motion.     Right lower leg: Edema present.     Left lower leg: Edema present.  Skin:    Capillary Refill: Capillary refill takes less than 2 seconds.  Neurological:     Mental Status: She is alert.      UC Treatments / Results  Labs (all labs ordered are listed, but only abnormal results are displayed) Labs Reviewed  CBC WITH DIFFERENTIAL/PLATELET - Abnormal; Notable for the following components:      Result Value   RDW 18.0 (*)    All other components within normal limits  COMPREHENSIVE METABOLIC PANEL - Abnormal; Notable for the following components:   Glucose, Bld 134 (*)    Calcium 8.7 (*)    Total Protein 6.0 (*)    Albumin 2.9 (*)    AST 132 (*)    ALT 153 (*)    Alkaline Phosphatase 127 (*)    All other components within normal limits  SARS CORONAVIRUS 2 (TAT 6-24 HRS)  BRAIN NATRIURETIC PEPTIDE    EKG   Radiology DG Chest 2 View  Result Date: 12/01/2019 CLINICAL DATA:  Orthopnea. Shortness of breath. EXAM: CHEST - 2 VIEW COMPARISON:  September 28, 2011 FINDINGS: The heart size is enlarged. Aortic calcifications are noted. There are moderate-sized bilateral pleural effusions with adjacent compressive atelectasis. There are prominent interstitial lung markings with Kerley B lines. There is no pneumothorax. No acute osseous abnormality. IMPRESSION: Findings consistent with CHF with moderate-sized bilateral pleural effusions and adjacent compressive atelectasis. Electronically Signed   By: Katherine Mantle M.D.   On: 12/01/2019 15:14   DG Abd 1 View  Result Date: 12/01/2019 CLINICAL DATA:  Shortness of breath. Abdominal cramping. EXAM: ABDOMEN - 1 VIEW COMPARISON:  None. FINDINGS: There is no evidence for small bowel obstruction. There is a moderate amount of stool in the colon. There is evidence for hepatosplenomegaly with the liver measuring approximately 23 cm craniocaudad in the spleen  measuring approximately 13 cm craniocaudad. There are no radiopaque kidney stones. No pneumatosis. No free air. IMPRESSION: 1. Hepatosplenomegaly. 2. Moderate amount of stool in the colon. 3. No evidence for small bowel obstruction. Electronically Signed   By: Katherine Mantle M.D.   On: 12/01/2019 15:15    Procedures Procedures (including critical care time)  Medications Ordered in UC Medications - No data to display  Initial Impression / Assessment and Plan / UC Course  I have reviewed the triage vital signs and the nursing notes.  Pertinent labs & imaging results that were available during my care of the patient were reviewed by me and considered in my medical decision making (see chart for details).     1.  Acute congestive heart failure: Chest x-ray significant for CHF and moderate pleural effusions Patient will require diuresis and further work-up for congestive heart failure Patient is advised to go to the emergency department for hospitalization, diuresis and further work-up of the congestive heart failure  2.  Constipation: Abdominal x-ray is significant for moderate stool burden Laxatives on a regular basis will be helpful. Final Clinical Impressions(s) / UC Diagnoses   Final diagnoses:  Acute congestive heart failure, unspecified heart failure type [facility])   Discharge Instructions   None    ED Prescriptions    None     PDMP not reviewed this encounter.   Merrilee Jansky, MD 12/01/19 [phone]

## 2019-12-02 ENCOUNTER — Observation Stay (HOSPITAL_COMMUNITY): Payer: Medicare Other

## 2019-12-02 ENCOUNTER — Emergency Department (HOSPITAL_COMMUNITY): Payer: Medicare Other

## 2019-12-02 ENCOUNTER — Encounter (HOSPITAL_COMMUNITY): Payer: Self-pay | Admitting: Family Medicine

## 2019-12-02 DIAGNOSIS — R000 Tachycardia, unspecified: Secondary | ICD-10-CM | POA: Diagnosis present

## 2019-12-02 DIAGNOSIS — E8770 Fluid overload, unspecified: Secondary | ICD-10-CM | POA: Diagnosis present

## 2019-12-02 DIAGNOSIS — K219 Gastro-esophageal reflux disease without esophagitis: Secondary | ICD-10-CM | POA: Diagnosis present

## 2019-12-02 DIAGNOSIS — E8779 Other fluid overload: Secondary | ICD-10-CM | POA: Diagnosis not present

## 2019-12-02 DIAGNOSIS — I5021 Acute systolic (congestive) heart failure: Secondary | ICD-10-CM | POA: Diagnosis present

## 2019-12-02 DIAGNOSIS — Z20822 Contact with and (suspected) exposure to covid-19: Secondary | ICD-10-CM | POA: Diagnosis present

## 2019-12-02 DIAGNOSIS — R0689 Other abnormalities of breathing: Secondary | ICD-10-CM | POA: Diagnosis not present

## 2019-12-02 DIAGNOSIS — F22 Delusional disorders: Secondary | ICD-10-CM | POA: Diagnosis not present

## 2019-12-02 DIAGNOSIS — R7401 Elevation of levels of liver transaminase levels: Secondary | ICD-10-CM | POA: Diagnosis present

## 2019-12-02 DIAGNOSIS — Z8249 Family history of ischemic heart disease and other diseases of the circulatory system: Secondary | ICD-10-CM | POA: Diagnosis not present

## 2019-12-02 DIAGNOSIS — R7989 Other specified abnormal findings of blood chemistry: Secondary | ICD-10-CM

## 2019-12-02 DIAGNOSIS — J9601 Acute respiratory failure with hypoxia: Secondary | ICD-10-CM | POA: Diagnosis present

## 2019-12-02 DIAGNOSIS — I340 Nonrheumatic mitral (valve) insufficiency: Secondary | ICD-10-CM

## 2019-12-02 DIAGNOSIS — R162 Hepatomegaly with splenomegaly, not elsewhere classified: Secondary | ICD-10-CM | POA: Diagnosis present

## 2019-12-02 DIAGNOSIS — Z7982 Long term (current) use of aspirin: Secondary | ICD-10-CM | POA: Diagnosis not present

## 2019-12-02 DIAGNOSIS — I428 Other cardiomyopathies: Secondary | ICD-10-CM | POA: Diagnosis present

## 2019-12-02 DIAGNOSIS — K5900 Constipation, unspecified: Secondary | ICD-10-CM | POA: Diagnosis present

## 2019-12-02 DIAGNOSIS — I509 Heart failure, unspecified: Secondary | ICD-10-CM | POA: Diagnosis not present

## 2019-12-02 DIAGNOSIS — J9811 Atelectasis: Secondary | ICD-10-CM | POA: Diagnosis present

## 2019-12-02 DIAGNOSIS — R140 Abdominal distension (gaseous): Secondary | ICD-10-CM | POA: Diagnosis present

## 2019-12-02 DIAGNOSIS — R0600 Dyspnea, unspecified: Secondary | ICD-10-CM | POA: Diagnosis not present

## 2019-12-02 DIAGNOSIS — I272 Pulmonary hypertension, unspecified: Secondary | ICD-10-CM | POA: Diagnosis present

## 2019-12-02 DIAGNOSIS — F408 Other phobic anxiety disorders: Secondary | ICD-10-CM | POA: Diagnosis present

## 2019-12-02 DIAGNOSIS — K761 Chronic passive congestion of liver: Secondary | ICD-10-CM | POA: Diagnosis present

## 2019-12-02 DIAGNOSIS — I313 Pericardial effusion (noninflammatory): Secondary | ICD-10-CM | POA: Diagnosis present

## 2019-12-02 DIAGNOSIS — R0602 Shortness of breath: Secondary | ICD-10-CM | POA: Diagnosis present

## 2019-12-02 LAB — SARS CORONAVIRUS 2 (TAT 6-24 HRS)
SARS Coronavirus 2: NEGATIVE
SARS Coronavirus 2: NEGATIVE

## 2019-12-02 LAB — LIPID PANEL
Cholesterol: 161 mg/dL (ref 0–200)
HDL: 49 mg/dL (ref >40)
LDL Cholesterol: 90 mg/dL (ref 0–99)
Total CHOL/HDL Ratio: 3.3 RATIO
Triglycerides: 112 mg/dL (ref <150)
VLDL: 22 mg/dL (ref 0–40)

## 2019-12-02 LAB — HEMOGLOBIN A1C
Hgb A1c MFr Bld: 6 % — ABNORMAL HIGH (ref 4.8–5.6)
Mean Plasma Glucose: 125.5 mg/dL

## 2019-12-02 LAB — HEPATITIS PANEL, ACUTE
HCV Ab: NONREACTIVE
Hep A IgM: NONREACTIVE
Hep B C IgM: NONREACTIVE
Hepatitis B Surface Ag: NONREACTIVE

## 2019-12-02 LAB — TROPONIN I (HIGH SENSITIVITY)
Troponin I (High Sensitivity): 44 ng/L — ABNORMAL HIGH (ref <18)
Troponin I (High Sensitivity): 44 ng/L — ABNORMAL HIGH (ref <18)

## 2019-12-02 LAB — CBC
HCT: 44.4 % (ref 36.0–46.0)
Hemoglobin: 14.1 g/dL (ref 12.0–15.0)
MCH: 30.3 pg (ref 26.0–34.0)
MCHC: 31.8 g/dL (ref 30.0–36.0)
MCV: 95.3 fL (ref 80.0–100.0)
Platelets: 302 10*3/uL (ref 150–400)
RBC: 4.66 MIL/uL (ref 3.87–5.11)
RDW: 17.4 % — ABNORMAL HIGH (ref 11.5–15.5)
WBC: 4.6 10*3/uL (ref 4.0–10.5)
nRBC: 0 % (ref 0.0–0.2)

## 2019-12-02 LAB — HIV ANTIBODY (ROUTINE TESTING W REFLEX): HIV Screen 4th Generation wRfx: NONREACTIVE

## 2019-12-02 LAB — CREATININE, SERUM
Creatinine, Ser: 0.7 mg/dL (ref 0.44–1.00)
GFR calc Af Amer: >60 mL/min (ref >60)
GFR calc non Af Amer: >60 mL/min (ref >60)

## 2019-12-02 LAB — ECHOCARDIOGRAM COMPLETE
Height: 63 in
Weight: 2469.15 oz

## 2019-12-02 LAB — LIPASE, BLOOD: Lipase: 19 U/L (ref 11–51)

## 2019-12-02 LAB — MAGNESIUM: Magnesium: 2 mg/dL (ref 1.7–2.4)

## 2019-12-02 LAB — TSH: TSH: 0.985 u[IU]/mL (ref 0.350–4.500)

## 2019-12-02 MED ORDER — FUROSEMIDE 10 MG/ML IJ SOLN
20.0000 mg | Freq: Once | INTRAMUSCULAR | Status: AC
  Administered 2019-12-02: 05:00:00 20 mg via INTRAVENOUS
  Filled 2019-12-02: qty 2

## 2019-12-02 MED ORDER — ENOXAPARIN SODIUM 40 MG/0.4ML ~~LOC~~ SOLN
40.0000 mg | SUBCUTANEOUS | Status: DC
  Administered 2019-12-03: 09:00:00 40 mg via SUBCUTANEOUS
  Filled 2019-12-02: qty 0.4

## 2019-12-02 MED ORDER — POTASSIUM CHLORIDE CRYS ER 20 MEQ PO TBCR
20.0000 meq | EXTENDED_RELEASE_TABLET | Freq: Two times a day (BID) | ORAL | Status: DC
  Administered 2019-12-03 – 2019-12-04 (×3): 20 meq via ORAL
  Filled 2019-12-02 (×3): qty 1

## 2019-12-02 MED ORDER — LOSARTAN POTASSIUM 25 MG PO TABS
25.0000 mg | ORAL_TABLET | Freq: Every day | ORAL | Status: DC
  Administered 2019-12-02 – 2019-12-04 (×3): 25 mg via ORAL
  Filled 2019-12-02 (×3): qty 1

## 2019-12-02 MED ORDER — FAMOTIDINE 20 MG PO TABS
20.0000 mg | ORAL_TABLET | Freq: Two times a day (BID) | ORAL | Status: DC
  Administered 2019-12-02 – 2019-12-05 (×7): 20 mg via ORAL
  Filled 2019-12-02 (×7): qty 1

## 2019-12-02 MED ORDER — METOPROLOL SUCCINATE ER 25 MG PO TB24
12.5000 mg | ORAL_TABLET | Freq: Every day | ORAL | Status: DC
  Administered 2019-12-03 – 2019-12-05 (×3): 12.5 mg via ORAL
  Filled 2019-12-02 (×3): qty 1

## 2019-12-02 MED ORDER — FUROSEMIDE 10 MG/ML IJ SOLN
20.0000 mg | Freq: Two times a day (BID) | INTRAMUSCULAR | Status: DC
  Administered 2019-12-02 – 2019-12-04 (×4): 20 mg via INTRAVENOUS
  Filled 2019-12-02 (×4): qty 2

## 2019-12-02 MED ORDER — FUROSEMIDE 10 MG/ML IJ SOLN
20.0000 mg | Freq: Once | INTRAMUSCULAR | Status: AC
  Administered 2019-12-02: 14:00:00 20 mg via INTRAVENOUS
  Filled 2019-12-02: qty 2

## 2019-12-02 NOTE — Progress Notes (Signed)
  Echocardiogram 2D Echocardiogram has been performed.  Leta Jungling M 12/02/2019, 10:02 AM

## 2019-12-02 NOTE — H&P (Signed)
[facility] History and Physical  Clara Herbison VOZ:[id number] DOB: 06/13/1951 DOA: 12/01/2019  Referring EDP: Sherryle Lis, PA PCP: Charlott Rakes, MD   Chief Complaint: SOB  HPI: Robin Patel is a 69 y.o. female with no significant PMH who presents to ER with report of worsening SOB for past 2 weeks. Findings c/w new onset CHF.  Patient reports feeling in her usual state of health until two weeks ago when she began to notice SOB. This has continued to worsen. Reports trouble sleeping at night as she has to use several pillows and feels SOB. She has noted some ankle swelling bilaterally. Reports low appetite and that nothing really tastes good. Some abdominal bloating but no pain. A few weeks ago she went to urgent care for some epigastric pain and was started on Famotidine and Simethicone. Reports some constipation but did have a bowel movement yesterday. Unsure if she has gained weight. Denies headache, dizziness, fever, chills, cough, chest pain, abdominal pain, vomiting, diarrhea, dysuria, hematuria, hematochezia, melena, difficulty moving arms/legs, speech difficulty, confusion or any other complaints. Denies hx of HTN, smoking. She dose have a family history of heart failure.  In the ED: Mild tachycardia and hypertension otherwise vitals stable. Labs at Urgent Care remarkable for: Elevated AST, ALT, Alk phos, glucose 134, BNP 848. CBC WNL. XR of Chest and Abdomen: Findings consistent with CHF with moderate-sized bilateral pleural effusions and adjacent compressive atelectasis. 1. Hepatosplenomegaly. 2. Moderate amount of stool in the colon. 3. No evidence for small bowel obstruction. RUQ [place] 1. Negative liver and gallbladder. 2. Right pleural effusion and distended appearance of the IVC, question right heart failure. Patient was given Lasix and called for admission for likely new-onset CHF.  Review of Systems:  All other systems negative unless noted above in HPI.   History  reviewed. No pertinent past medical history. Past Surgical History:  Procedure Laterality Date  . HAND SURGERY Bilateral   . TUBAL LIGATION     Social History:  reports that she has never smoked. She has never used smokeless tobacco. She reports that she does not drink alcohol or use drugs.  No Known Allergies  Family History  Problem Relation Age of Onset  . Heart failure Mother   . Hypertension Father   . Heart failure Father     Prior to Admission medications   Medication Sig Start Date End Date Taking? Authorizing Provider  aspirin 325 MG EC tablet Take 325 mg by mouth daily.    [provider]  famotidine (PEPCID) 20 MG tablet Take 1 tablet (20 mg total) by mouth 2 (two) times daily. 11/17/19 12/17/19  Melynda Ripple, MD  Simethicone 250 MG CAPS Take 1 capsule by mouth 2 (two) times daily as needed. 11/17/19   Melynda Ripple, MD   Physical Exam: Vitals:   12/01/19 1600 12/01/19 1627 12/02/19 0133 12/02/19 0313  BP: (!) 136/92  (!) 127/92 (!) 143/83  Pulse: (!) 108  (!) 106 (!) 114  Resp:   16 (!) 22  Temp: 98.4 F (36.9 C)     TempSrc: Oral     SpO2: 98%  99% 97%  Weight:  70 kg    Height:  5' 3"  (1.6 m)      Wt Readings from Last 3 Encounters:  12/01/19 70 kg  11/17/19 70.3 kg  11/27/17 72.2 kg    . General:  Appears calm and comfortable. AAOx4.  . Eyes: EOMI, normal lids, irises & conjunctiva . ENT: grossly normal hearing, lips &  tongue . Neck: normal ROM . Cardiovascular: Mild tachycardia with regular rhythm, no m/r/g. Mild LE edema without pitting. Marland Kitchen Respiratory: Using abdominal muscles to breathe. Has to stop to breathe after every few words. Crackles noted in lung bases bilaterally.  . Abdomen: soft, ntnd . Skin: no rash or induration seen on limited exam . Musculoskeletal: grossly normal tone BUE/BLE . Psychiatric: grossly normal mood and affect, speech fluent and appropriate . Neurologic: grossly non-focal.          Labs on Admission:   Basic Metabolic Panel: Recent Labs  Lab 12/01/19 1444  NA 141  K 3.8  CL 108  CO2 22  GLUCOSE 134*  BUN 16  CREATININE 0.65  CALCIUM 8.7*   Liver Function Tests: Recent Labs  Lab 12/01/19 1444  AST 132*  ALT 153*  ALKPHOS 127*  BILITOT 0.6  PROT 6.0*  ALBUMIN 2.9*   No results for input(s): LIPASE, AMYLASE in the last 168 hours. No results for input(s): AMMONIA in the last 168 hours. CBC: Recent Labs  Lab 12/01/19 1444  WBC 4.4  NEUTROABS 1.9  HGB 12.6  HCT 39.1  MCV 96.1  PLT 272   Cardiac Enzymes: No results for input(s): CKTOTAL, CKMB, CKMBINDEX, TROPONINI in the last 168 hours.  BNP (last 3 results) Recent Labs    12/01/19 1444  BNP 848.7*    ProBNP (last 3 results) No results for input(s): PROBNP in the last 8760 hours.  CBG: No results for input(s): GLUCAP in the last 168 hours.  Radiological Exams on Admission: DG Chest 2 View  Result Date: 12/01/2019 CLINICAL DATA:  Orthopnea. Shortness of breath. EXAM: CHEST - 2 VIEW COMPARISON:  September 28, 2011 FINDINGS: The heart size is enlarged. Aortic calcifications are noted. There are moderate-sized bilateral pleural effusions with adjacent compressive atelectasis. There are prominent interstitial lung markings with Kerley B lines. There is no pneumothorax. No acute osseous abnormality. IMPRESSION: Findings consistent with CHF with moderate-sized bilateral pleural effusions and adjacent compressive atelectasis. Electronically Signed   By: Constance Holster M.D.   On: 12/01/2019 15:14   DG Abd 1 View  Result Date: 12/01/2019 CLINICAL DATA:  Shortness of breath. Abdominal cramping. EXAM: ABDOMEN - 1 VIEW COMPARISON:  None. FINDINGS: There is no evidence for small bowel obstruction. There is a moderate amount of stool in the colon. There is evidence for hepatosplenomegaly with the liver measuring approximately 23 cm craniocaudad in the spleen measuring approximately 13 cm craniocaudad. There are no  radiopaque kidney stones. No pneumatosis. No free air. IMPRESSION: 1. Hepatosplenomegaly. 2. Moderate amount of stool in the colon. 3. No evidence for small bowel obstruction. Electronically Signed   By: Constance Holster M.D.   On: 12/01/2019 15:15   US Abdomen Limited RUQ  Result Date: 12/02/2019 CLINICAL DATA:  Elevated transaminase EXAM: ULTRASOUND ABDOMEN LIMITED RIGHT UPPER QUADRANT COMPARISON:  None. FINDINGS: Gallbladder: No gallstones or wall thickening visualized. No sonographic Murphy sign noted by sonographer. Common bile duct: Diameter: 4 mm.  Where visualized, no filling defect. Liver: No focal lesion identified. Within normal limits in parenchymal echogenicity. Portal vein is patent on color Doppler imaging with normal direction of blood flow towards the liver. Other: Right pleural effusion. Distended appearance of the IVC on sagittal images IMPRESSION: 1. Negative liver and gallbladder. 2. Right pleural effusion and distended appearance of the IVC, question right heart failure. Electronically Signed   By: Monte Fantasia M.D.   On: 12/02/2019 04:55    EKG: Independently reviewed.  HR 110. Sinus tachycardia. QTc 462. No STEMI.  Assessment/Plan Principal Problem:   Fluid overload Active Problems:   Dyspnea and respiratory abnormalities   Elevated LFTs  69 y.o. female with no significant PMH who presents to ER with report of worsening SOB for past 2 weeks. Findings c/w new onset CHF.  Dyspnea/SOB Fluid Overload  - Vitals stable however patient now wearing some O2 as this makes her symptomatically feel better - Working to breathe with accessory abdominal muscles and speech interrupted by breathing - Denies chest pain  - Orthopnea  - family hx of heart failure  - Mild lower extremity swelling - CXR, and [place] fitting with CHF - Elevated BNP - Will obtain Echo - Lasix 20 mg IV ordered in ED; will give 20 mg more later today - Daily weights - I's and O's - Troponin pending  -  A1c and Lipid panel for risk stratification - Monitor BP's; likely start on ACE-I/ARB, Beta blocker and diuretics PRN  Elevated LFTs - likely secondary to hepatic congestion from fluid overload - Hepatitis panel pending  - Trend  Code Status: Full  DVT Prophylaxis: Lovenox Family Communication: Spouse at bedside Disposition Plan: Admit to Observation. Patient utilizing accessory muscles and requiring IV diuresis. Anticipate discharge home tomorrow.   Time spent: 40 minutes   Chauncey Mann, MD [facility] Pager [phone]

## 2019-12-02 NOTE — ED Notes (Signed)
Pt ambulatory to the restroom with minimal assistance from staff. Pt maintain oxygen saturations of 94-98% on room air. Pt's heart rate maintain around 123bpm. Pt felt short of breath and seemed to begin to panic stating that she "needs her oxygen" and was very upset.

## 2019-12-02 NOTE — Progress Notes (Addendum)
PROGRESS NOTE    Patient: Robin Patel                            PCP: Charlott Rakes, MD                    DOB: 1951/05/11            DOA: 12/01/2019 GXQ:[id number]             DOS: 12/02/2019, 10:39 AM   LOS: 0 days   Date of Service: The patient was seen and examined on 12/02/2019  Subjective:   The patient was seen and examined this morning, remained stable.  Currently denying any chest pain, remained stable on room air satting 97%  Brief Narrative:   Robin Patel is a 69 y.o.  female with no significant past medical history presenting with shortness of breath, past 2 weeks, findings currently consistent with new onset CHF.  Upon evaluation in ED patient was mildly tachycardic, hypertensive, BNP 848, chest x-ray consistent with CHF finding, moderate-sized bilateral pleural effusion, hepatosplenomegaly,, moderate stool. Subsequent admitted for further CHF duration, on diuretics.  Patient was started on IV Lasix, 2D echocardiogram ordered, cardiology was consulted.   Assessment & Plan:   Principal Problem:   Fluid overload Active Problems:   Dyspnea and respiratory abnormalities   Elevated LFTs   Dyspnea -volume overload -Likely new onset CHF systolic versus diastolic -Patient remained stable on 2 L of oxygen -No overt peripheral pitting edema -Patient has been initiated on IV Lasix, will be continued 20 mg daily -Trend daily weight - will monitor I's and O's -Follow labs including proBNP, A1c, fasting lipid panel -for risk stratification  RUQ [place] 1. Negative liver and gallbladder. 2. Right pleural effusion and distended appearance of the IVC, question right heart failure  Transaminitis -Possible due to hepatopulmonary congestion, -Continue diuretics -We will continue to monitor LFTs -Avoid hepatotoxins -Hepatitis panel reviewed nonreactive- neg  GERD -Continue Pepcid   Nutritional status:          ------------------------------------------------------------------------------------------------------------------------------ Cultures; none  Antimicrobials: None   =======================================================================  DVT prophylaxis: SCD/Compression stockings and Lovenox SQ Code Status:   Code Status: Full Code Family Communication: Husband at bedside The above findings and plan of care has been discussed with patient and family (husband) in detail,  they expressed understanding and agreement of above. Disposition Plan:   Anticipated 1-2 days Admission status:  NPATIEN -as patient needs continued monitoring, IV diuretics, for new onset CHF  Consultants: Cardiology  Procedures:   No admission procedures for hospital encounter.    Antimicrobials:  Anti-infectives (From admission, onward)   None       Medication:  . enoxaparin (LOVENOX) injection  40 mg Subcutaneous Q24H  . famotidine  20 mg Oral BID  . furosemide  20 mg Intravenous Once       Objective:   Vitals:   12/02/19 0313 12/02/19 0330 12/02/19 0530 12/02/19 0700  BP: (!) 143/83 123/90 (!) 129/95 114/85  Pulse: (!) 114 (!) 113 (!) 102 98  Resp: (!) 22 (!) 21 (!) 21 18  Temp:      TempSrc:      SpO2: 97% 94% 99% 97%  Weight:      Height:        Intake/Output Summary (Last 24 hours) at 12/02/2019 1039 Last data filed at 12/02/2019 0655 Gross per 24 hour  Intake --  Output 1200 ml  Net -  1200 ml   Filed Weights   12/01/19 1627  Weight: 70 kg     Examination:   Physical Exam  Constitution:  Alert, cooperative, no distress,  Appears calm and comfortable  Psychiatric: Normal and stable mood and affect, cognition intact,   HEENT: Normocephalic, PERRL, otherwise with in Normal limits  Chest:Chest symmetric Cardio vascular:  S1/S2, RRR, No murmure, No Rubs or Gallops  pulmonary: Clear to auscultation bilaterally, respirations unlabored, negative wheezes / crackles Abdomen:  Soft, non-tender, non-distended, bowel sounds,no masses, no organomegaly Muscular skeletal: Limited exam - in bed, able to move all 4 extremities, Normal strength,  Neuro: CNII-XII intact. , normal motor and sensation, reflexes intact  Extremities: No pitting edema lower extremities, +2 pulses  Skin: Dry, warm to touch, negative for any Rashes, No open wounds Wounds: per nursing documentation    LABs:  CBC Latest Ref Rng & Units 12/02/2019 12/01/2019 10/07/2017  WBC 4.0 - 10.5 K/uL 4.6 4.4 4.7  Hemoglobin 12.0 - 15.0 g/dL 67.3 41.9 11.9(L)  Hematocrit 36.0 - 46.0 % 44.4 39.1 36.0  Platelets 150 - 400 K/uL 302 272 333   CMP Latest Ref Rng & Units 12/02/2019 12/01/2019 10/07/2017  Glucose 70 - 99 mg/dL - 379(K) 98  BUN 8 - 23 mg/dL - 16 15  Creatinine 2.40 - 1.00 mg/dL 9.73 5.32 9.92  Sodium 135 - 145 mmol/L - 141 139  Potassium 3.5 - 5.1 mmol/L - 3.8 3.8  Chloride 98 - 111 mmol/L - 108 104  CO2 22 - 32 mmol/L - 22 24  Calcium 8.9 - 10.3 mg/dL - 8.7(L) 9.0  Total Protein 6.5 - 8.1 g/dL - 6.0(L) 7.7  Total Bilirubin 0.3 - 1.2 mg/dL - 0.6 0.4  Alkaline Phos 38 - 126 U/L - 127(H) 81  AST 15 - 41 U/L - 132(H) 21  ALT 0 - 44 U/L - 153(H) 24    SIGNED: Kendell Bane, MD, FACP, FHM. [facility],  Pager [phone] (please utilize amion for paging)  If 7PM-7AM, please contact night-coverage [email or web address] 12/02/2019, 10:39 AM

## 2019-12-02 NOTE — ED Notes (Signed)
MS   bfast ordered  

## 2019-12-02 NOTE — ED Notes (Signed)
Lunch Tray Ordered @ 1054. 

## 2019-12-02 NOTE — ED Provider Notes (Signed)
[facility] EMERGENCY DEPARTMENT Provider Note   CSN: [id number] Arrival date & time: 12/01/19  1556     History Chief Complaint  Patient presents with  . Shortness of Breath    Robin Patel is a 69 y.o. female with no significant past medical history who presents to the emergency department from urgent care with a chief complaint of shortness of breath.  The patient reports progressively worsening shortness of breath, orthopnea, and paroxysmal nocturnal dyspnea for the last 2 weeks.  She also reports that she has had some swelling in her bilateral ankles that improves with elevating her legs.  This has been accompanied by a productive cough.  She is unsure if she has had weight gain.   She denies chest pain, pressure, or tightness, dizziness, near syncope, weakness, numbness, back pain, nausea, vomiting, or diarrhea.  No fever or chills.  She also notes that over the last 2 weeks that she has increased abdominal bloating.  She presented to urgent care on March 16 with complaints that "gas" with crampy, sharp epigastric abdominal pain that would come in waves.  She reported that it would intermittently radiate up into the center of her chest and the pain would improve with belching.  She was also endorsing constipation.  She was started on Pepcid and simethicone, which she has continued to take for her symptoms.  She reports that she has continued to feel constipated, but did have a bowel movement earlier today.  No history of heart failure.  No history of CAD.  She is not immunocompromise and has no history of hypertension.  She is a non-smoker.  She denies currently using illicit or recreational substances or history of drug use.  She does not use alcohol.  She is not established with a primary care provider.  She has never seen a cardiologist.  The history is provided by the patient. No language interpreter was used.       History reviewed. No pertinent past  medical history.  Patient Active Problem List   Diagnosis Date Noted  . Fluid overload 12/02/2019  . Dyspnea and respiratory abnormalities 12/02/2019  . Elevated LFTs 12/02/2019    Past Surgical History:  Procedure Laterality Date  . HAND SURGERY Bilateral   . TUBAL LIGATION       OB History   No obstetric history on file.     Family History  Problem Relation Age of Onset  . Heart failure Mother   . Hypertension Father   . Heart failure Father     Social History   Tobacco Use  . Smoking status: Never Smoker  . Smokeless tobacco: Never Used  Substance Use Topics  . Alcohol use: No  . Drug use: No    Home Medications Prior to Admission medications   Medication Sig Start Date End Date Taking? Authorizing Provider  aspirin 325 MG EC tablet Take 325 mg by mouth daily.   Yes [provider]  bismuth subsalicylate (PEPTO BISMOL) 262 MG/15ML suspension Take 30 mLs by mouth every 6 (six) hours as needed for indigestion or diarrhea or loose stools.   Yes [provider]  famotidine (PEPCID) 20 MG tablet Take 1 tablet (20 mg total) by mouth 2 (two) times daily. 11/17/19 12/17/19 Yes Melynda Ripple, MD  guaiFENesin (MUCINEX) 600 MG 12 hr tablet Take 600 mg by mouth 2 (two) times daily as needed for cough.   Yes [provider]  Multiple Vitamins-Minerals (MULTIVITAMIN WITH MINERALS) tablet Take  1 tablet by mouth daily.   Yes [provider]  Simethicone 250 MG CAPS Take 1 capsule by mouth 2 (two) times daily as needed. 11/17/19  Yes Melynda Ripple, MD    Allergies    Patient has no known allergies.  Review of Systems   Review of Systems  Constitutional: Negative for activity change, chills and fever.  HENT: Negative for congestion, ear discharge, ear pain, facial swelling, sinus pressure, sinus pain, sore throat and voice change.   Eyes: Negative for visual disturbance.  Respiratory: Positive for cough and shortness of breath.  Negative for wheezing.   Cardiovascular: Positive for leg swelling. Negative for chest pain and palpitations.  Gastrointestinal: Positive for constipation. Negative for abdominal pain, diarrhea, nausea and vomiting.       Abdominal bloating, belching  Genitourinary: Negative for dysuria.  Musculoskeletal: Negative for back pain, myalgias, neck pain and neck stiffness.  Skin: Negative for rash.  Allergic/Immunologic: Negative for immunocompromised state.  Neurological: Negative for dizziness, seizures, syncope, weakness, numbness and headaches.  Psychiatric/Behavioral: Negative for confusion.    Physical Exam Updated Vital Signs BP (!) 129/95   Pulse (!) 102   Temp 98.4 F (36.9 C) (Oral)   Resp (!) 21   Ht 5' 3"  (1.6 m)   Wt 70 kg   SpO2 99%   BMI 27.34 kg/m   Physical Exam Vitals and nursing note reviewed.  Constitutional:      General: She is not in acute distress.    Comments: Anxious appearing.  Nasal cannula in place.  HENT:     Head: Normocephalic.  Eyes:     Conjunctiva/sclera: Conjunctivae normal.  Cardiovascular:     Rate and Rhythm: Regular rhythm. Tachycardia present.     Heart sounds: No murmur. No friction rub. No gallop.   Pulmonary:     Effort: Pulmonary effort is normal. No respiratory distress.     Comments: Crackles in the bilateral bases. Nasal cannula is in place, but is not attached to supplemental oxygen.  Patient has mild accessory muscle use and retractions.  This improves when patient is placed on 1.5 L nasal cannula. Chest:     Chest wall: No tenderness.  Abdominal:     General: There is no distension.     Palpations: Abdomen is soft. There is no mass.     Tenderness: There is abdominal tenderness. There is no right CVA tenderness, left CVA tenderness, guarding or rebound.     Hernia: No hernia is present.     Comments: Patient are noted to be unbuttoned on exam.  Tender to palpation in the epigastric and right upper quadrant.  Negative  Murphy sign.  No peritoneal signs.  No tenderness over McBurney's point.  No CVA tenderness bilaterally.  Musculoskeletal:     Cervical back: Neck supple.     Comments: Minimal peripheral nonpitting edema noted around the bilateral ankles.  Skin:    General: Skin is warm.     Findings: No rash.  Neurological:     General: No focal deficit present.     Mental Status: She is alert.  Psychiatric:        Behavior: Behavior normal.     ED Results / Procedures / Treatments   Labs (all labs ordered are listed, but only abnormal results are displayed) Labs Reviewed  HEMOGLOBIN A1C - Abnormal; Notable for the following components:      Result Value   Hgb A1c MFr Bld 6.0 (*)    All  other components within normal limits  CBC - Abnormal; Notable for the following components:   RDW 17.4 (*)    All other components within normal limits  TROPONIN I (HIGH SENSITIVITY) - Abnormal; Notable for the following components:   Troponin I (High Sensitivity) 44 (*)    All other components within normal limits  TROPONIN I (HIGH SENSITIVITY) - Abnormal; Notable for the following components:   Troponin I (High Sensitivity) 44 (*)    All other components within normal limits  SARS CORONAVIRUS 2 (TAT 6-24 HRS)  LIPASE, BLOOD  MAGNESIUM  LIPID PANEL  CREATININE, SERUM  HIV ANTIBODY (ROUTINE TESTING W REFLEX)  HEPATITIS PANEL, ACUTE  TSH    EKG EKG Interpretation  Date/Time:  Tuesday December 01 2019 16:13:24 EDT Ventricular Rate:  111 PR Interval:  134 QRS Duration: 86 QT Interval:  340 QTC Calculation: 462 R Axis:   -42 Text Interpretation: Sinus tachycardia Left axis deviation Septal infarct , age undetermined Abnormal ECG very similar to earlier in day Confirmed by Merrily Pew [phone]) on 12/01/2019 11:21:52 PM   Radiology DG Chest 2 View  Result Date: 12/01/2019 CLINICAL DATA:  Orthopnea. Shortness of breath. EXAM: CHEST - 2 VIEW COMPARISON:  September 28, 2011 FINDINGS: The heart size is  enlarged. Aortic calcifications are noted. There are moderate-sized bilateral pleural effusions with adjacent compressive atelectasis. There are prominent interstitial lung markings with Kerley B lines. There is no pneumothorax. No acute osseous abnormality. IMPRESSION: Findings consistent with CHF with moderate-sized bilateral pleural effusions and adjacent compressive atelectasis. Electronically Signed   By: Constance Holster M.D.   On: 12/01/2019 15:14   DG Abd 1 View  Result Date: 12/01/2019 CLINICAL DATA:  Shortness of breath. Abdominal cramping. EXAM: ABDOMEN - 1 VIEW COMPARISON:  None. FINDINGS: There is no evidence for small bowel obstruction. There is a moderate amount of stool in the colon. There is evidence for hepatosplenomegaly with the liver measuring approximately 23 cm craniocaudad in the spleen measuring approximately 13 cm craniocaudad. There are no radiopaque kidney stones. No pneumatosis. No free air. IMPRESSION: 1. Hepatosplenomegaly. 2. Moderate amount of stool in the colon. 3. No evidence for small bowel obstruction. Electronically Signed   By: Constance Holster M.D.   On: 12/01/2019 15:15   US Abdomen Limited RUQ  Result Date: 12/02/2019 CLINICAL DATA:  Elevated transaminase EXAM: ULTRASOUND ABDOMEN LIMITED RIGHT UPPER QUADRANT COMPARISON:  None. FINDINGS: Gallbladder: No gallstones or wall thickening visualized. No sonographic Murphy sign noted by sonographer. Common bile duct: Diameter: 4 mm.  Where visualized, no filling defect. Liver: No focal lesion identified. Within normal limits in parenchymal echogenicity. Portal vein is patent on color Doppler imaging with normal direction of blood flow towards the liver. Other: Right pleural effusion. Distended appearance of the IVC on sagittal images IMPRESSION: 1. Negative liver and gallbladder. 2. Right pleural effusion and distended appearance of the IVC, question right heart failure. Electronically Signed   By: Monte Fantasia M.D.    On: 12/02/2019 04:55    Procedures Procedures (including critical care time)  Medications Ordered in ED Medications  furosemide (LASIX) injection 20 mg (has no administration in time range)  famotidine (PEPCID) tablet 20 mg (has no administration in time range)  enoxaparin (LOVENOX) injection 40 mg (has no administration in time range)  furosemide (LASIX) injection 20 mg (20 mg Intravenous Given 12/02/19 0439)    ED Course  I have reviewed the triage vital signs and the nursing notes.  Pertinent labs & imaging  results that were available during my care of the patient were reviewed by me and considered in my medical decision making (see chart for details).    MDM Rules/Calculators/A&P                      69 year old female with no significant past medical history presenting with 2 weeks of progressively worsening shortness of breath, peripheral edema, orthopnea, paroxysmal nocturnal dyspnea that has also been accompanied by abdominal bloating and increased belching.  Labs ordered by urgent care with BNP of 849.  Chest x-ray with moderate sized bilateral pleural effusions concerning for CHF.  Patient does appear volume overloaded on exam.  She was able to ambulate without hypoxia, but on room air at rest she has some accessory muscle use and mild retractions that improved with the patient was placed on 1.5 L nasal cannula.  She also has paroxysmal tachycardia, but I suspect this may be related to anxiety given that she has no chronic medical conditions.  Patient has no history of CAD.  She has never seen a cardiologist.  No history of heart failure.  EKG was sinus tachycardia. Will add on troponin, but low suspicion for ACS at this time as she denies chest pain.  Labs are also notable for AST and ALT of 132 and 153 respectively, new from 2019. Alk phos is also elevated at 127.  Suspect this is related to hepatic congestion, but given complaints of persistent epigastric pain over the last 2  weeks, will order right upper quadrant ultrasound.  IV Lasix given in the ER.  Patient and independently evaluated by Dr. Dayna Barker, attending physician. Patient will require admission for initial episode of acute congestive heart failure, especially in the absence of being established with an outpatient cardiologist or primary care provider.  COVID-19 test is pending.  Consult to the hospitalist team and Dr. Marice Potter will accept the patient for admission. The patient appears reasonably stabilized for admission considering the current resources, flow, and capabilities available in the ED at this time, and I doubt any other [facility] requiring further screening and/or treatment in the ED prior to admission.   Final Clinical Impression(s) / ED Diagnoses Final diagnoses:  Elevated transaminase level  Acute congestive heart failure, unspecified heart failure type [facility])    Rx / DC Orders ED Discharge Orders    None       Joanne Gavel, PA-C 12/02/19 4888    Mesner, Corene Cornea, MD 12/02/19 9169    Merrily Pew, MD 12/03/19 2300

## 2019-12-02 NOTE — ED Provider Notes (Signed)
Medical screening examination/treatment/procedure(s) were conducted as a shared visit with non-physician practitioner(s) and myself.  I personally evaluated the patient during the encounter.  A couple weeks of dyspnea and PND with some le edema. Seen at urgent care, labs done and sent here for further eval. No h/o htn or cad.  Exam with crackles bilaterally, tachypneic but no distress.  Plan for admission for diuresis and further workup.   EKG Interpretation  Date/Time:  Tuesday December 01 2019 16:13:24 EDT Ventricular Rate:  111 PR Interval:  134 QRS Duration: 86 QT Interval:  340 QTC Calculation: 462 R Axis:   -42 Text Interpretation: Sinus tachycardia Left axis deviation Septal infarct , age undetermined Abnormal ECG very similar to earlier in day Confirmed by Marily Memos [phone]) on 12/01/2019 11:21:52 PM     Audery Wassenaar, Barbara Cower, MD 12/03/19 2300

## 2019-12-02 NOTE — Consult Note (Addendum)
Cardiology Consultation:   Patient ID: Robin Patel MRN: [id number]; DOB: 10-19-1950  Admit date: 12/01/2019 Date of Consult: 12/02/2019  Primary Care Provider: Charlott Rakes, MD Primary Cardiologist: No primary care provider on file.  Primary Electrophysiologist:  None    Patient Profile:   Robin Patel is a 69 y.o. female with a hx of no significant pmh who is being seen today for the evaluation of new onset CHF at the request of Dr. Roger Shelter.  History of Present Illness:   Robin Patel has not been seen by cardiology in the past. She has no history of MI, stent, arrhythmia, stroke, diabetes, HTN, or HLD. Family history includes MI in her mother at 65 and MI in her father in his late 6s . Denies tobacco/alcohol/drug use. She recently started taking medication for constipation. She lives with her husband and has 5 grown children. She eats relatively healthy, has been a vegetarian most of her life.    The patient went to the Urgent care 12/01/19 for shortness of breath, orthopnea, and PND. Denied chest pain, palpitations, dizziness, near syncope. CXR showed moderate pleural effusions and she was recommended to go to the ED for diuresis. The patient went to the ED and reported symptoms for the last week. Symptoms had been gradual. She also noted lower leg edema that has improved with elevation. Unsure if she had weight gain. Also reported abdominal bloating.  In the ED BP 129/95, pulse 102, afebrile, RR 21, 99% O2 on 1.5L [place]. Labs showed A1C 6.0, AST 132, ALT 153, Alk phos 127. Creatinine 0.70. Hs troponin 44 > 44. EKG showed sinus tach, rate 111, TWI inferolateral leads, minimal st dep V5 and V6. BNP 849. WBC 4.6, Hgb 14.1. CXR with moderate sized B/L pleural effusions concerning for CHF. The patient was given IV lasix in the ED. RUQ [place] negative liver and gallbladder, right pleural effusions and distended appearance of the IVC. The patient was admitted for further work-up.    History  reviewed. No pertinent past medical history.  Past Surgical History:  Procedure Laterality Date  . HAND SURGERY Bilateral   . TUBAL LIGATION       Home Medications:  Prior to Admission medications   Medication Sig Start Date End Date Taking? Authorizing Provider  aspirin 325 MG EC tablet Take 325 mg by mouth daily.   Yes [provider]  bismuth subsalicylate (PEPTO BISMOL) 262 MG/15ML suspension Take 30 mLs by mouth every 6 (six) hours as needed for indigestion or diarrhea or loose stools.   Yes [provider]  famotidine (PEPCID) 20 MG tablet Take 1 tablet (20 mg total) by mouth 2 (two) times daily. 11/17/19 12/17/19 Yes Melynda Ripple, MD  guaiFENesin (MUCINEX) 600 MG 12 hr tablet Take 600 mg by mouth 2 (two) times daily as needed for cough.   Yes [provider]  Multiple Vitamins-Minerals (MULTIVITAMIN WITH MINERALS) tablet Take 1 tablet by mouth daily.   Yes [provider]  Simethicone 250 MG CAPS Take 1 capsule by mouth 2 (two) times daily as needed. 11/17/19  Yes Melynda Ripple, MD    Inpatient Medications: Scheduled Meds: . enoxaparin (LOVENOX) injection  40 mg Subcutaneous Q24H  . famotidine  20 mg Oral BID  . furosemide  20 mg Intravenous Once   Continuous Infusions:  PRN Meds:   Allergies:   No Known Allergies  Social History:   Social History   Socioeconomic History  . Marital status: Married    Spouse name: Not  on file  . Number of children: Not on file  . Years of education: Not on file  . Highest education level: Not on file  Occupational History  . Not on file  Tobacco Use  . Smoking status: Never Smoker  . Smokeless tobacco: Never Used  Substance and Sexual Activity  . Alcohol use: No  . Drug use: No  . Sexual activity: Not on file  Other Topics Concern  . Not on file  Social History Narrative  . Not on file   Social Determinants of Health   Financial Resource Strain:   . Difficulty of Paying Living  Expenses:   Food Insecurity:   . Worried About Charity fundraiser in the Last Year:   . Arboriculturist in the Last Year:   Transportation Needs:   . Film/video editor (Medical):   Marland Kitchen Lack of Transportation (Non-Medical):   Physical Activity:   . Days of Exercise per Week:   . Minutes of Exercise per Session:   Stress:   . Feeling of Stress :   Social Connections:   . Frequency of Communication with Friends and Family:   . Frequency of Social Gatherings with Friends and Family:   . Attends Religious Services:   . Active Member of Clubs or Organizations:   . Attends Archivist Meetings:   Marland Kitchen Marital Status:   Intimate Partner Violence:   . Fear of Current or Ex-Partner:   . Emotionally Abused:   Marland Kitchen Physically Abused:   . Sexually Abused:     Family History:   Family History  Problem Relation Age of Onset  . Heart failure Mother   . Hypertension Father   . Heart failure Father      ROS:  Please see the history of present illness.  All other ROS reviewed and negative.     Physical Exam/Data:   Vitals:   12/02/19 0700 12/02/19 0739 12/02/19 0917 12/02/19 1051  BP: 114/85     Pulse: 98 95 (!) 111 99  Resp: 18 16 (!) 25 (!) 22  Temp:      TempSrc:      SpO2: 97% 99% 99% 97%  Weight:      Height:        Intake/Output Summary (Last 24 hours) at 12/02/2019 1211 Last data filed at 12/02/2019 0655 Gross per 24 hour  Intake --  Output 1200 ml  Net -1200 ml   Last 3 Weights 12/01/2019 11/17/2019 11/27/2017  Weight (lbs) 154 lb 5.2 oz 155 lb 159 lb 3.2 oz  Weight (kg) 70 kg 70.308 kg 72.213 kg     Body mass index is 27.34 kg/m.  General:  Well nourished, well developed, in no acute distress HEENT: normal Lymph: no adenopathy Neck: + JVD Endocrine:  No thryomegaly Vascular: No carotid bruits; FA pulses 2+ bilaterally without bruits  Cardiac:  normal S1, S2; RRR; tachycardia; no murmur  Lungs:  Diminished at bases Abd: soft, nontender, no hepatomegaly   Ext: no edema Musculoskeletal:  No deformities, BUE and BLE strength normal and equal Skin: warm and dry  Neuro:  CNs 2-12 intact, no focal abnormalities noted Psych:  Normal affect   EKG:  The EKG was personally reviewed and demonstrates:  NSR, 105 bpm, TWI inferolateral leads, minimal st dep V5 and V6.  Telemetry:  Telemetry was personally reviewed and demonstrates:  Sinus tach, HR 100-120, PVCs  Relevant CV Studies:  Echo ordered  Laboratory Data:  High  Sensitivity Troponin:   Recent Labs  Lab 12/02/19 0441  TROPONINIHS 44*  44*     Chemistry Recent Labs  Lab 12/01/19 1444 12/02/19 0441  NA 141  --   K 3.8  --   CL 108  --   CO2 22  --   GLUCOSE 134*  --   BUN 16  --   CREATININE 0.65 0.70  CALCIUM 8.7*  --   GFRNONAA >60 >60  GFRAA >60 >60  ANIONGAP 11  --     Recent Labs  Lab 12/01/19 1444  PROT 6.0*  ALBUMIN 2.9*  AST 132*  ALT 153*  ALKPHOS 127*  BILITOT 0.6   Hematology Recent Labs  Lab 12/01/19 1444 12/02/19 0441  WBC 4.4 4.6  RBC 4.07 4.66  HGB 12.6 14.1  HCT 39.1 44.4  MCV 96.1 95.3  MCH 31.0 30.3  MCHC 32.2 31.8  RDW 18.0* 17.4*  PLT 272 302   BNP Recent Labs  Lab 12/01/19 1444  BNP 848.7*    DDimer No results for input(s): DDIMER in the last 168 hours.   Radiology/Studies:  DG Chest 2 View  Result Date: 12/01/2019 CLINICAL DATA:  Orthopnea. Shortness of breath. EXAM: CHEST - 2 VIEW COMPARISON:  September 28, 2011 FINDINGS: The heart size is enlarged. Aortic calcifications are noted. There are moderate-sized bilateral pleural effusions with adjacent compressive atelectasis. There are prominent interstitial lung markings with Kerley B lines. There is no pneumothorax. No acute osseous abnormality. IMPRESSION: Findings consistent with CHF with moderate-sized bilateral pleural effusions and adjacent compressive atelectasis. Electronically Signed   By: Constance Holster M.D.   On: 12/01/2019 15:14   DG Abd 1 View  Result Date:  12/01/2019 CLINICAL DATA:  Shortness of breath. Abdominal cramping. EXAM: ABDOMEN - 1 VIEW COMPARISON:  None. FINDINGS: There is no evidence for small bowel obstruction. There is a moderate amount of stool in the colon. There is evidence for hepatosplenomegaly with the liver measuring approximately 23 cm craniocaudad in the spleen measuring approximately 13 cm craniocaudad. There are no radiopaque kidney stones. No pneumatosis. No free air. IMPRESSION: 1. Hepatosplenomegaly. 2. Moderate amount of stool in the colon. 3. No evidence for small bowel obstruction. Electronically Signed   By: Constance Holster M.D.   On: 12/01/2019 15:15   US Abdomen Limited RUQ  Result Date: 12/02/2019 CLINICAL DATA:  Elevated transaminase EXAM: ULTRASOUND ABDOMEN LIMITED RIGHT UPPER QUADRANT COMPARISON:  None. FINDINGS: Gallbladder: No gallstones or wall thickening visualized. No sonographic Murphy sign noted by sonographer. Common bile duct: Diameter: 4 mm.  Where visualized, no filling defect. Liver: No focal lesion identified. Within normal limits in parenchymal echogenicity. Portal vein is patent on color Doppler imaging with normal direction of blood flow towards the liver. Other: Right pleural effusion. Distended appearance of the IVC on sagittal images IMPRESSION: 1. Negative liver and gallbladder. 2. Right pleural effusion and distended appearance of the IVC, question right heart failure. Electronically Signed   By: Monte Fantasia M.D.   On: 12/02/2019 04:55    Assessment and Plan:   SOB/New onset acute heart failure Patient presented with 2 weeks of progressive SOB, PND, orthopnea, and lower leg edema. BNP elevated to 800 and CXR with B/L pleural effusions. HS troponin 44>44.  Also noted to have elevated LFTs. Started on supplemental O2. No chest pain. COVID negative.  - Patient was given IV lasix 20 in the ED and another 20 mg today - Echo ordered. Unofficial read by Dr. Harrington Challenger EF  20-25%, small pericardial effusion  with no signs of tamponade. - patient has put out 1.2L since arrival. Patient feels breathing is better - daily weights, strict I/Os, and monitor creatinine - would continue with diuresis with IV lasix  - start Losartan 25 mg daily.  - Start Toprol-XL 12.5 mg daily tomorrow - Patient will likely need ischemic eval. Will keep NPO after midnight. Will discuss options further with patient in the morning.  - MD to see  Elevated LFTs - possible from congestion from fluid overload - US abdomen unremarkable  For questions or updates, please contact [place] Please consult [email or web address] for contact info under     Signed, Cadence Ninfa Meeker, PA-C  12/02/2019 12:11 PM   Patient seen and examined   I agree with findings as noted above by C FUrth   Pt is a 69 yo female with no prior cardiac history    OVer the past week or two she has noticed increased SOB, fatigue    Denies CP   NO recent viral infection  No FHx of CHF  On exam, pt is in NAD though appears fatigued HR 102  BP 120s/90s NEck with increased JVP Lungs with decreased BS at bases Cardiac:  RRR   Normal S1, S2  +S3  NO signif murmurs Abd is supple with mild RUQ tenderness Ext with trace edema    EKG with ST 111 bpm  Anteroseptal MI  SL ST depression in lateral leads  I have reviewed echo imagess  LVmildly dilated and LVEF is severely depressed at approximately 20%   There is mod MR    RV function appears normal     Impresison:   Acute systolic CHF  Etiol:  Unclear   Probably nonischemic though with age need to r/o CAD  Concerning is tachycardia and elevated LFTs   Recomm:   Would try initial trial of diuresis with lasix and close follow up of I/O and BP/HR    If does not respond would recomm PICC line for COOX and possible milrinone use  At this point review with CHF team  Dorris Carnes MD  At some point would recomm R and L heart cath to define pressures, anatomy  WIll continue to follow

## 2019-12-03 DIAGNOSIS — E8779 Other fluid overload: Secondary | ICD-10-CM

## 2019-12-03 DIAGNOSIS — I5021 Acute systolic (congestive) heart failure: Principal | ICD-10-CM

## 2019-12-03 LAB — COMPREHENSIVE METABOLIC PANEL
ALT: 124 U/L — ABNORMAL HIGH (ref 0–44)
AST: 87 U/L — ABNORMAL HIGH (ref 15–41)
Albumin: 2.8 g/dL — ABNORMAL LOW (ref 3.5–5.0)
Alkaline Phosphatase: 109 U/L (ref 38–126)
Anion gap: 9 (ref 5–15)
BUN: 18 mg/dL (ref 8–23)
CO2: 29 mmol/L (ref 22–32)
Calcium: 8.5 mg/dL — ABNORMAL LOW (ref 8.9–10.3)
Chloride: 103 mmol/L (ref 98–111)
Creatinine, Ser: 0.88 mg/dL (ref 0.44–1.00)
GFR calc Af Amer: >60 mL/min (ref >60)
GFR calc non Af Amer: >60 mL/min (ref >60)
Glucose, Bld: 111 mg/dL — ABNORMAL HIGH (ref 70–99)
Potassium: 3.9 mmol/L (ref 3.5–5.1)
Sodium: 141 mmol/L (ref 135–145)
Total Bilirubin: 1.1 mg/dL (ref 0.3–1.2)
Total Protein: 6.1 g/dL — ABNORMAL LOW (ref 6.5–8.1)

## 2019-12-03 MED ORDER — LIDOCAINE-PRILOCAINE 2.5-2.5 % EX CREA
TOPICAL_CREAM | Freq: Once | CUTANEOUS | Status: DC
  Filled 2019-12-03 (×2): qty 5

## 2019-12-03 NOTE — Progress Notes (Signed)
Reviewed with pt CHF      WOuld recomm R and L heart cath to define anatomy and pressures  Described risks / benefits    Pt is reflecting   Will keep NPO after MN today and speak to her in AM     Dietrich Pates MD

## 2019-12-03 NOTE — H&P (View-Only) (Signed)
Progress Note  Patient Name: Robin Patel Date of Encounter: 12/03/2019  Primary Cardiologist:  New  Subjective   Breathign is better   No CP    Inpatient Medications    Scheduled Meds: . enoxaparin (LOVENOX) injection  40 mg Subcutaneous Q24H  . famotidine  20 mg Oral BID  . furosemide  20 mg Intravenous BID  . losartan  25 mg Oral Daily  . metoprolol succinate  12.5 mg Oral Daily  . potassium chloride  20 mEq Oral BID   Continuous Infusions:  PRN Meds:    Vital Signs    Vitals:   12/02/19 1637 12/02/19 1641 12/02/19 2045 12/03/19 0530  BP:  111/80 110/79 104/79  Pulse:  98    Resp:  16 20 15   Temp:  98.7 F (37.1 C) 98 F (36.7 C) 98.8 F (37.1 C)  TempSrc:  Oral Oral Oral  SpO2:  100% 99% 97%  Weight: 69.4 kg   69 kg  Height: 5\' 3"  (1.6 m)       Intake/Output Summary (Last 24 hours) at 12/03/2019 0655 Last data filed at 12/03/2019 0400 Gross per 24 hour  Intake --  Output 3650 ml  Net -3650 ml   Net neg 4.8 L   Last 3 Weights 12/03/2019 12/02/2019 12/01/2019  Weight (lbs) 152 lb 1.9 oz 153 lb 154 lb 5.2 oz  Weight (kg) 69 kg 69.4 kg 70 kg      Telemetry    SR  - Personally Reviewed  ECG    None - Personally Reviewed  Physical Exam   GEN: No acute distress.   Neck: JVP is increased   Cardiac: RRR, no murmurs + S3   Respiratory: Clear to auscultation bilaterally. GI: Soft, nontender, non-distended  MS: No edema; No deformity. Neuro:  Nonfocal  Psych: Normal affect   Labs    High Sensitivity Troponin:   Recent Labs  Lab 12/02/19 0441  TROPONINIHS 44*  44*      Chemistry Recent Labs  Lab 12/01/19 1444 12/02/19 0441 12/03/19 0318  NA 141  --  141  K 3.8  --  3.9  CL 108  --  103  CO2 22  --  29  GLUCOSE 134*  --  111*  BUN 16  --  18  CREATININE 0.65 0.70 0.88  CALCIUM 8.7*  --  8.5*  PROT 6.0*  --  6.1*  ALBUMIN 2.9*  --  2.8*  AST 132*  --  87*  ALT 153*  --  124*  ALKPHOS 127*  --  109  BILITOT 0.6  --  1.1   GFRNONAA >60 >60 >60  GFRAA >60 >60 >60  ANIONGAP 11  --  9     Hematology Recent Labs  Lab 12/01/19 1444 12/02/19 0441  WBC 4.4 4.6  RBC 4.07 4.66  HGB 12.6 14.1  HCT 39.1 44.4  MCV 96.1 95.3  MCH 31.0 30.3  MCHC 32.2 31.8  RDW 18.0* 17.4*  PLT 272 302    BNP Recent Labs  Lab 12/01/19 1444  BNP 848.7*     DDimer No results for input(s): DDIMER in the last 168 hours.   Radiology    DG Chest 2 View  Result Date: 12/01/2019 CLINICAL DATA:  Orthopnea. Shortness of breath. EXAM: CHEST - 2 VIEW COMPARISON:  September 28, 2011 FINDINGS: The heart size is enlarged. Aortic calcifications are noted. There are moderate-sized bilateral pleural effusions with adjacent compressive atelectasis. There are prominent interstitial lung  markings with Kerley B lines. There is no pneumothorax. No acute osseous abnormality. IMPRESSION: Findings consistent with CHF with moderate-sized bilateral pleural effusions and adjacent compressive atelectasis. Electronically Signed   By: Katherine Mantle M.D.   On: 12/01/2019 15:14   DG Abd 1 View  Result Date: 12/01/2019 CLINICAL DATA:  Shortness of breath. Abdominal cramping. EXAM: ABDOMEN - 1 VIEW COMPARISON:  None. FINDINGS: There is no evidence for small bowel obstruction. There is a moderate amount of stool in the colon. There is evidence for hepatosplenomegaly with the liver measuring approximately 23 cm craniocaudad in the spleen measuring approximately 13 cm craniocaudad. There are no radiopaque kidney stones. No pneumatosis. No free air. IMPRESSION: 1. Hepatosplenomegaly. 2. Moderate amount of stool in the colon. 3. No evidence for small bowel obstruction. Electronically Signed   By: Katherine Mantle M.D.   On: 12/01/2019 15:15   ECHOCARDIOGRAM COMPLETE  Result Date: 12/02/2019    ECHOCARDIOGRAM REPORT   Patient Name:   Robin Patel Date of Exam: 12/02/2019 Medical Rec #:  [id number]      Height:       63.0 in Accession #:    [id number]      Weight:       154.3 lb Date of Birth:  04-12-1951      BSA:          1.732 m Patient Age:    69 years       BP:           114/85 mmHg Patient Gender: F              HR:           98 bpm. Exam Location:  Inpatient Procedure: 2D Echo Indications:    Dyspnea 786.09 / R06.00  History:        Patient has no prior history of Echocardiogram examinations.                 Signs/Symptoms:Dyspnea; Risk Factors:Family History of Coronary                 Artery Disease. Elevated LFTs. Fluid overload.  Sonographer:    Leta Jungling RDCS Referring Phys: [id number] CHELSEA N FAIR IMPRESSIONS  1. Left ventricular ejection fraction, by estimation, is <20%. The left ventricle has severely decreased function. The left ventricle demonstrates global hypokinesis. The left ventricular internal cavity size was moderately dilated. Left ventricular diastolic parameters are indeterminate.  2. Right ventricular systolic function is normal. The right ventricular size is normal.  3. There is RA diastolic collapse but no significant variability in mitral or tricuspid inflow velocties with respiratory variation. Not suggestive of tamponade.. Moderate pericardial effusion. Moderate pleural effusion in both left and right lateral regions.  4. The mitral valve is normal in structure. Mild mitral valve regurgitation. No evidence of mitral stenosis.  5. The aortic valve is tricuspid. Aortic valve regurgitation is trivial. No aortic stenosis is present.  6. The inferior vena cava is dilated in size with <50% respiratory variability, suggesting right atrial pressure of 15 mmHg. Comparison(s): No prior Echocardiogram. Conclusion(s)/Recommendation(s): Severely reduced LVEF, <20% with global hypokinesis. There is a moderate pericardial effusion with RA collapse, but no respiratory variation. Not suggestive of tamponade, recommend clinical correlation. Significant pleural effusions and ascites also seen. FINDINGS  Left Ventricle: Left ventricular ejection  fraction, by estimation, is <20%. The left ventricle has severely decreased function. The left ventricle demonstrates global hypokinesis. The left ventricular internal cavity size was moderately  dilated. There is no left ventricular hypertrophy. Left ventricular diastolic parameters are indeterminate. Right Ventricle: The right ventricular size is normal. No increase in right ventricular wall thickness. Right ventricular systolic function is normal. Left Atrium: Left atrial size was normal in size. Right Atrium: Right atrial size was normal in size. Pericardium: There is RA diastolic collapse but no significant variability in mitral or tricuspid inflow velocties with respiratory variation. Not suggestive of tamponade. A moderately sized pericardial effusion is present. There is diastolic collapse of  the right atrial wall. Mitral Valve: The mitral valve is normal in structure. Mild mitral valve regurgitation. No evidence of mitral valve stenosis. Tricuspid Valve: The tricuspid valve is normal in structure. Tricuspid valve regurgitation is trivial. No evidence of tricuspid stenosis. Aortic Valve: The aortic valve is tricuspid. Aortic valve regurgitation is trivial. No aortic stenosis is present. Pulmonic Valve: The pulmonic valve was not well visualized. Pulmonic valve regurgitation is not visualized. No evidence of pulmonic stenosis. Aorta: The aortic root and ascending aorta are structurally normal, with no evidence of dilitation. Venous: The inferior vena cava is dilated in size with less than 50% respiratory variability, suggesting right atrial pressure of 15 mmHg. IAS/Shunts: No atrial level shunt detected by color flow Doppler. Additional Comments: There is a moderate pleural effusion in both left and right lateral regions. Moderate ascites is present.  LEFT VENTRICLE PLAX 2D LVIDd:         5.30 cm LVIDs:         4.90 cm LV PW:         0.81 cm LV IVS:        0.89 cm LVOT diam:     1.90 cm LV SV:         39 LV  SV Index:   23 LVOT Area:     2.84 cm  LV Volumes (MOD) LV vol d, MOD A2C: 195.0 ml LV vol d, MOD A4C: 201.0 ml LV vol s, MOD A2C: 111.0 ml LV vol s, MOD A4C: 142.0 ml LV SV MOD A2C:     84.0 ml LV SV MOD A4C:     201.0 ml LV SV MOD BP:      68.7 ml RIGHT VENTRICLE RV S prime:     14.90 cm/s TAPSE (M-mode): 2.3 cm LEFT ATRIUM             Index       RIGHT ATRIUM           Index LA diam:        3.80 cm 2.19 cm/m  RA Area:     12.80 cm LA Vol (A2C):   54.7 ml 31.58 ml/m RA Volume:   27.70 ml  15.99 ml/m LA Vol (A4C):   43.9 ml 25.35 ml/m LA Biplane Vol: 50.7 ml 29.27 ml/m  AORTIC VALVE LVOT Vmax:   109.00 cm/s LVOT Vmean:  70.000 cm/s LVOT VTI:    0.139 m  AORTA Ao Root diam: 2.50 cm MITRAL VALVE MV Area (PHT): 4.89 cm     SHUNTS MV Decel Time: 155 msec     Systemic VTI:  0.14 m MV E velocity: 90.80 cm/s   Systemic Diam: 1.90 cm MV A velocity: 105.00 cm/s MV E/A ratio:  0.86 Jodelle Red MD Electronically signed by Jodelle Red MD Signature Date/Time: 12/02/2019/3:04:18 PM    Final    US Abdomen Limited RUQ  Result Date: 12/02/2019 CLINICAL DATA:  Elevated transaminase EXAM: ULTRASOUND ABDOMEN LIMITED RIGHT UPPER QUADRANT  COMPARISON:  None. FINDINGS: Gallbladder: No gallstones or wall thickening visualized. No sonographic Murphy sign noted by sonographer. Common bile duct: Diameter: 4 mm.  Where visualized, no filling defect. Liver: No focal lesion identified. Within normal limits in parenchymal echogenicity. Portal vein is patent on color Doppler imaging with normal direction of blood flow towards the liver. Other: Right pleural effusion. Distended appearance of the IVC on sagittal images IMPRESSION: 1. Negative liver and gallbladder. 2. Right pleural effusion and distended appearance of the IVC, question right heart failure. Electronically Signed   By: Jonathon  Watts M.D.   On: 12/02/2019 04:55    Cardiac Studies   1. Left ventricular ejection fraction, by estimation, is <20%. The  left ventricle has severely decreased function. The left ventricle demonstrates global hypokinesis. The left ventricular internal cavity size was moderately dilated. Left ventricular diastolic parameters are indeterminate. 2. Right ventricular systolic function is normal. The right ventricular size is normal. 3. There is RA diastolic collapse but no significant variability in mitral or tricuspid inflow velocties with respiratory variation. Not suggestive of tamponade.. Moderate pericardial effusion. Moderate pleural effusion in both left and right lateral regions. 4. The mitral valve is normal in structure. Mild mitral valve regurgitation. No evidence of mitral stenosis. 5. The aortic valve is tricuspid. Aortic valve regurgitation is trivial. No aortic stenosis is present. 6. The inferior vena cava is dilated in size with <50% respiratory variability, suggesting right atrial pressure of 15 mmHg. Comparison(s): No prior Echocardiogram.  Patient Profile     68 y.o. female presents with new onset SOB/CHF  Assessment & Plan    1  CHF  Acute systolic CHF   Etiology  ?    Pt has diuresed significanlty   Very responsive to lasix IV  Still with some volume increase on exam Continue   On low dose toprol and losartan  BP low 100s   Discussed with pt possible L heart cath to define anatomy   She is not that interested   Important to rule out reversible causes for CHF   Also, would help with determining filling pressures   Will review again \\  2  Elevated LFTs  Improving    For questions or updates, please contact [facility] Please consult [email or web address] for contact info under        Signed, Alpha Chouinard, MD  12/03/2019, 6:55 AM    
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
## 2019-12-03 NOTE — Progress Notes (Signed)
ReDS Clip Diuretic Study Pt study # [id number]  Your patient is in the Blinded arm of the ReDS Clip Diuretic study.  Your patient has had a ReDS reading and the reading has been transmitted to the cloud.   Thank You,    The Research Team  Duwane Gewirtz, PharmD, BCCCP Clinical Pharmacist  Phone: [phone]  Please check AMION for all [facility] phone numbers After 10:00 PM, call Main Pharmacy [phone]   

## 2019-12-03 NOTE — Progress Notes (Signed)
Progress Note  Patient Name: Robin Patel Date of Encounter: 12/03/2019  Primary Cardiologist:  New  Subjective   Breathign is better   No CP    Inpatient Medications    Scheduled Meds: . enoxaparin (LOVENOX) injection  40 mg Subcutaneous Q24H  . famotidine  20 mg Oral BID  . furosemide  20 mg Intravenous BID  . losartan  25 mg Oral Daily  . metoprolol succinate  12.5 mg Oral Daily  . potassium chloride  20 mEq Oral BID   Continuous Infusions:  PRN Meds:    Vital Signs    Vitals:   12/02/19 1637 12/02/19 1641 12/02/19 2045 12/03/19 0530  BP:  111/80 110/79 104/79  Pulse:  98    Resp:  16 20 15   Temp:  98.7 F (37.1 C) 98 F (36.7 C) 98.8 F (37.1 C)  TempSrc:  Oral Oral Oral  SpO2:  100% 99% 97%  Weight: 69.4 kg   69 kg  Height: 5\' 3"  (1.6 m)       Intake/Output Summary (Last 24 hours) at 12/03/2019 0655 Last data filed at 12/03/2019 0400 Gross per 24 hour  Intake --  Output 3650 ml  Net -3650 ml   Net neg 4.8 L   Last 3 Weights 12/03/2019 12/02/2019 12/01/2019  Weight (lbs) 152 lb 1.9 oz 153 lb 154 lb 5.2 oz  Weight (kg) 69 kg 69.4 kg 70 kg      Telemetry    SR  - Personally Reviewed  ECG    None - Personally Reviewed  Physical Exam   GEN: No acute distress.   Neck: JVP is increased   Cardiac: RRR, no murmurs + S3   Respiratory: Clear to auscultation bilaterally. GI: Soft, nontender, non-distended  MS: No edema; No deformity. Neuro:  Nonfocal  Psych: Normal affect   Labs    High Sensitivity Troponin:   Recent Labs  Lab 12/02/19 0441  TROPONINIHS 44*  44*      Chemistry Recent Labs  Lab 12/01/19 1444 12/02/19 0441 12/03/19 0318  NA 141  --  141  K 3.8  --  3.9  CL 108  --  103  CO2 22  --  29  GLUCOSE 134*  --  111*  BUN 16  --  18  CREATININE 0.65 0.70 0.88  CALCIUM 8.7*  --  8.5*  PROT 6.0*  --  6.1*  ALBUMIN 2.9*  --  2.8*  AST 132*  --  87*  ALT 153*  --  124*  ALKPHOS 127*  --  109  BILITOT 0.6  --  1.1   GFRNONAA >60 >60 >60  GFRAA >60 >60 >60  ANIONGAP 11  --  9     Hematology Recent Labs  Lab 12/01/19 1444 12/02/19 0441  WBC 4.4 4.6  RBC 4.07 4.66  HGB 12.6 14.1  HCT 39.1 44.4  MCV 96.1 95.3  MCH 31.0 30.3  MCHC 32.2 31.8  RDW 18.0* 17.4*  PLT 272 302    BNP Recent Labs  Lab 12/01/19 1444  BNP 848.7*     DDimer No results for input(s): DDIMER in the last 168 hours.   Radiology    DG Chest 2 View  Result Date: 12/01/2019 CLINICAL DATA:  Orthopnea. Shortness of breath. EXAM: CHEST - 2 VIEW COMPARISON:  September 28, 2011 FINDINGS: The heart size is enlarged. Aortic calcifications are noted. There are moderate-sized bilateral pleural effusions with adjacent compressive atelectasis. There are prominent interstitial lung  markings with Kerley B lines. There is no pneumothorax. No acute osseous abnormality. IMPRESSION: Findings consistent with CHF with moderate-sized bilateral pleural effusions and adjacent compressive atelectasis. Electronically Signed   By: Katherine Mantle M.D.   On: 12/01/2019 15:14   DG Abd 1 View  Result Date: 12/01/2019 CLINICAL DATA:  Shortness of breath. Abdominal cramping. EXAM: ABDOMEN - 1 VIEW COMPARISON:  None. FINDINGS: There is no evidence for small bowel obstruction. There is a moderate amount of stool in the colon. There is evidence for hepatosplenomegaly with the liver measuring approximately 23 cm craniocaudad in the spleen measuring approximately 13 cm craniocaudad. There are no radiopaque kidney stones. No pneumatosis. No free air. IMPRESSION: 1. Hepatosplenomegaly. 2. Moderate amount of stool in the colon. 3. No evidence for small bowel obstruction. Electronically Signed   By: Katherine Mantle M.D.   On: 12/01/2019 15:15   ECHOCARDIOGRAM COMPLETE  Result Date: 12/02/2019    ECHOCARDIOGRAM REPORT   Patient Name:   Robin Patel Date of Exam: 12/02/2019 Medical Rec #:  [id number]      Height:       63.0 in Accession #:    [id number]      Weight:       154.3 lb Date of Birth:  04-12-1951      BSA:          1.732 m Patient Age:    69 years       BP:           114/85 mmHg Patient Gender: F              HR:           98 bpm. Exam Location:  Inpatient Procedure: 2D Echo Indications:    Dyspnea 786.09 / R06.00  History:        Patient has no prior history of Echocardiogram examinations.                 Signs/Symptoms:Dyspnea; Risk Factors:Family History of Coronary                 Artery Disease. Elevated LFTs. Fluid overload.  Sonographer:    Leta Jungling RDCS Referring Phys: [id number] [place]HELSEA N FAIR IMPRESSIONS  1. Left ventricular ejection fraction, by estimation, is <20%. The left ventricle has severely decreased function. The left ventricle demonstrates global hypokinesis. The left ventricular internal cavity size was moderately dilated. Left ventricular diastolic parameters are indeterminate.  2. Right ventricular systolic function is normal. The right ventricular size is normal.  3. There is RA diastolic collapse but no significant variability in mitral or tricuspid inflow velocties with respiratory variation. Not suggestive of tamponade.. Moderate pericardial effusion. Moderate pleural effusion in both left and right lateral regions.  4. The mitral valve is normal in structure. Mild mitral valve regurgitation. No evidence of mitral stenosis.  5. The aortic valve is tricuspid. Aortic valve regurgitation is trivial. No aortic stenosis is present.  6. The inferior vena cava is dilated in size with <50% respiratory variability, suggesting right atrial pressure of 15 mmHg. Comparison(s): No prior Echocardiogram. Conclusion(s)/Recommendation(s): Severely reduced LVEF, <20% with global hypokinesis. There is a moderate pericardial effusion with RA collapse, but no respiratory variation. Not suggestive of tamponade, recommend clinical correlation. Significant pleural effusions and ascites also seen. FINDINGS  Left Ventricle: Left ventricular ejection  fraction, by estimation, is <20%. The left ventricle has severely decreased function. The left ventricle demonstrates global hypokinesis. The left ventricular internal cavity size was moderately  dilated. There is no left ventricular hypertrophy. Left ventricular diastolic parameters are indeterminate. Right Ventricle: The right ventricular size is normal. No increase in right ventricular wall thickness. Right ventricular systolic function is normal. Left Atrium: Left atrial size was normal in size. Right Atrium: Right atrial size was normal in size. Pericardium: There is RA diastolic collapse but no significant variability in mitral or tricuspid inflow velocties with respiratory variation. Not suggestive of tamponade. A moderately sized pericardial effusion is present. There is diastolic collapse of  the right atrial wall. Mitral Valve: The mitral valve is normal in structure. Mild mitral valve regurgitation. No evidence of mitral valve stenosis. Tricuspid Valve: The tricuspid valve is normal in structure. Tricuspid valve regurgitation is trivial. No evidence of tricuspid stenosis. Aortic Valve: The aortic valve is tricuspid. Aortic valve regurgitation is trivial. No aortic stenosis is present. Pulmonic Valve: The pulmonic valve was not well visualized. Pulmonic valve regurgitation is not visualized. No evidence of pulmonic stenosis. Aorta: The aortic root and ascending aorta are structurally normal, with no evidence of dilitation. Venous: The inferior vena cava is dilated in size with less than 50% respiratory variability, suggesting right atrial pressure of 15 mmHg. IAS/Shunts: No atrial level shunt detected by color flow Doppler. Additional Comments: There is a moderate pleural effusion in both left and right lateral regions. Moderate ascites is present.  LEFT VENTRICLE PLAX 2D LVIDd:         5.30 cm LVIDs:         4.90 cm LV PW:         0.81 cm LV IVS:        0.89 cm LVOT diam:     1.90 cm LV SV:         39 LV  SV Index:   23 LVOT Area:     2.84 cm  LV Volumes (MOD) LV vol d, MOD A2C: 195.0 ml LV vol d, MOD A4C: 201.0 ml LV vol s, MOD A2C: 111.0 ml LV vol s, MOD A4C: 142.0 ml LV SV MOD A2C:     84.0 ml LV SV MOD A4C:     201.0 ml LV SV MOD BP:      68.7 ml RIGHT VENTRICLE RV S prime:     14.90 cm/s TAPSE (M-mode): 2.3 cm LEFT ATRIUM             Index       RIGHT ATRIUM           Index LA diam:        3.80 cm 2.19 cm/m  RA Area:     12.80 cm LA Vol (A2C):   54.7 ml 31.58 ml/m RA Volume:   27.70 ml  15.99 ml/m LA Vol (A4C):   43.9 ml 25.35 ml/m LA Biplane Vol: 50.7 ml 29.27 ml/m  AORTIC VALVE LVOT Vmax:   109.00 cm/s LVOT Vmean:  70.000 cm/s LVOT VTI:    0.139 m  AORTA Ao Root diam: 2.50 cm MITRAL VALVE MV Area (PHT): 4.89 cm     SHUNTS MV Decel Time: 155 msec     Systemic VTI:  0.14 m MV E velocity: 90.80 cm/s   Systemic Diam: 1.90 cm MV A velocity: 105.00 cm/s MV E/A ratio:  0.86 Jodelle Red MD Electronically signed by Jodelle Red MD Signature Date/Time: 12/02/2019/3:04:18 PM    Final    US Abdomen Limited RUQ  Result Date: 12/02/2019 CLINICAL DATA:  Elevated transaminase EXAM: ULTRASOUND ABDOMEN LIMITED RIGHT UPPER QUADRANT  COMPARISON:  None. FINDINGS: Gallbladder: No gallstones or wall thickening visualized. No sonographic Murphy sign noted by sonographer. Common bile duct: Diameter: 4 mm.  Where visualized, no filling defect. Liver: No focal lesion identified. Within normal limits in parenchymal echogenicity. Portal vein is patent on color Doppler imaging with normal direction of blood flow towards the liver. Other: Right pleural effusion. Distended appearance of the IVC on sagittal images IMPRESSION: 1. Negative liver and gallbladder. 2. Right pleural effusion and distended appearance of the IVC, question right heart failure. Electronically Signed   By: Marnee Spring M.D.   On: 12/02/2019 04:55    Cardiac Studies   1. Left ventricular ejection fraction, by estimation, is <20%. The  left ventricle has severely decreased function. The left ventricle demonstrates global hypokinesis. The left ventricular internal cavity size was moderately dilated. Left ventricular diastolic parameters are indeterminate. 2. Right ventricular systolic function is normal. The right ventricular size is normal. 3. There is RA diastolic collapse but no significant variability in mitral or tricuspid inflow velocties with respiratory variation. Not suggestive of tamponade.. Moderate pericardial effusion. Moderate pleural effusion in both left and right lateral regions. 4. The mitral valve is normal in structure. Mild mitral valve regurgitation. No evidence of mitral stenosis. 5. The aortic valve is tricuspid. Aortic valve regurgitation is trivial. No aortic stenosis is present. 6. The inferior vena cava is dilated in size with <50% respiratory variability, suggesting right atrial pressure of 15 mmHg. Comparison(s): No prior Echocardiogram.  Patient Profile     69 y.o. female presents with new onset SOB/CHF  Assessment & Plan    1  CHF  Acute systolic CHF   Etiology  ?    Pt has diuresed significanlty   Very responsive to lasix IV  Still with some volume increase on exam Continue   On low dose toprol and losartan  BP low 100s   Discussed with pt possible L heart cath to define anatomy   She is not that interested   Important to rule out reversible causes for CHF   Also, would help with determining filling pressures   Will review again \\  2  Elevated LFTs  Improving    For questions or updates, please contact [facility] Please consult [email or web address] for contact info under        Signed, Dietrich Pates, MD  12/03/2019, 6:55 AM

## 2019-12-04 ENCOUNTER — Other Ambulatory Visit: Payer: Self-pay | Admitting: Medical

## 2019-12-04 ENCOUNTER — Telehealth: Payer: Self-pay | Admitting: Medical

## 2019-12-04 ENCOUNTER — Encounter (HOSPITAL_COMMUNITY)
Admission: EM | Disposition: A | Discharge status: Home / Self Care | Payer: Self-pay | Source: Home / Self Care | Attending: Family Medicine

## 2019-12-04 DIAGNOSIS — I5021 Acute systolic (congestive) heart failure: Secondary | ICD-10-CM

## 2019-12-04 HISTORY — PX: RIGHT/LEFT HEART CATH AND CORONARY ANGIOGRAPHY: CATH118266

## 2019-12-04 LAB — POCT I-STAT 7, (LYTES, BLD GAS, ICA,H+H)
Acid-Base Excess: 4 mmol/L — ABNORMAL HIGH (ref 0.0–2.0)
Bicarbonate: 28 mmol/L (ref 20.0–28.0)
Calcium, Ion: 1.16 mmol/L (ref 1.15–1.40)
HCT: 43 % (ref 36.0–46.0)
Hemoglobin: 14.6 g/dL (ref 12.0–15.0)
O2 Saturation: 93 %
Potassium: 3.9 mmol/L (ref 3.5–5.1)
Sodium: 141 mmol/L (ref 135–145)
TCO2: 29 mmol/L (ref 22–32)
pCO2 arterial: 38.7 mmHg (ref 32.0–48.0)
pH, Arterial: 7.467 — ABNORMAL HIGH (ref 7.350–7.450)
pO2, Arterial: 64 mmHg — ABNORMAL LOW (ref 83.0–108.0)

## 2019-12-04 LAB — POCT I-STAT EG7
Acid-Base Excess: 4 mmol/L — ABNORMAL HIGH (ref 0.0–2.0)
Bicarbonate: 29.8 mmol/L — ABNORMAL HIGH (ref 20.0–28.0)
Calcium, Ion: 1.17 mmol/L (ref 1.15–1.40)
HCT: 43 % (ref 36.0–46.0)
Hemoglobin: 14.6 g/dL (ref 12.0–15.0)
O2 Saturation: 65 %
Potassium: 3.8 mmol/L (ref 3.5–5.1)
Sodium: 141 mmol/L (ref 135–145)
TCO2: 31 mmol/L (ref 22–32)
pCO2, Ven: 45.5 mmHg (ref 44.0–60.0)
pH, Ven: 7.423 (ref 7.250–7.430)
pO2, Ven: 33 mmHg (ref 32.0–45.0)

## 2019-12-04 LAB — CBC
HCT: 40.9 % (ref 36.0–46.0)
Hemoglobin: 13.1 g/dL (ref 12.0–15.0)
MCH: 30.6 pg (ref 26.0–34.0)
MCHC: 32 g/dL (ref 30.0–36.0)
MCV: 95.6 fL (ref 80.0–100.0)
Platelets: 279 10*3/uL (ref 150–400)
RBC: 4.28 MIL/uL (ref 3.87–5.11)
RDW: 16.9 % — ABNORMAL HIGH (ref 11.5–15.5)
WBC: 4.9 10*3/uL (ref 4.0–10.5)
nRBC: 0 % (ref 0.0–0.2)

## 2019-12-04 LAB — BASIC METABOLIC PANEL
Anion gap: 10 (ref 5–15)
BUN: 16 mg/dL (ref 8–23)
CO2: 27 mmol/L (ref 22–32)
Calcium: 8.5 mg/dL — ABNORMAL LOW (ref 8.9–10.3)
Chloride: 102 mmol/L (ref 98–111)
Creatinine, Ser: 0.89 mg/dL (ref 0.44–1.00)
GFR calc Af Amer: >60 mL/min (ref >60)
GFR calc non Af Amer: >60 mL/min (ref >60)
Glucose, Bld: 106 mg/dL — ABNORMAL HIGH (ref 70–99)
Potassium: 3.9 mmol/L (ref 3.5–5.1)
Sodium: 139 mmol/L (ref 135–145)

## 2019-12-04 SURGERY — RIGHT/LEFT HEART CATH AND CORONARY ANGIOGRAPHY
Anesthesia: LOCAL

## 2019-12-04 MED ORDER — HEPARIN SODIUM (PORCINE) 1000 UNIT/ML IJ SOLN
INTRAMUSCULAR | Status: AC
  Filled 2019-12-04: qty 1

## 2019-12-04 MED ORDER — HEPARIN (PORCINE) IN NACL 1000-0.9 UT/500ML-% IV SOLN
INTRAVENOUS | Status: AC
  Filled 2019-12-04: qty 500

## 2019-12-04 MED ORDER — HEPARIN (PORCINE) IN NACL 1000-0.9 UT/500ML-% IV SOLN
INTRAVENOUS | Status: DC | PRN
  Administered 2019-12-04: 500 mL

## 2019-12-04 MED ORDER — FUROSEMIDE 20 MG PO TABS: 20.0000 mg | ORAL_TABLET | Freq: Every day | ORAL | Status: DC

## 2019-12-04 MED ORDER — SODIUM CHLORIDE 0.9 % IV SOLN: INTRAVENOUS | Status: DC

## 2019-12-04 MED ORDER — METOPROLOL SUCCINATE ER 25 MG PO TB24: 12.5000 mg | ORAL_TABLET | Freq: Every day | ORAL | 3 refills | Status: DC

## 2019-12-04 MED ORDER — SODIUM CHLORIDE 0.9% FLUSH: 3.0000 mL | INTRAVENOUS | Status: DC | PRN

## 2019-12-04 MED ORDER — VERAPAMIL HCL 2.5 MG/ML IV SOLN: INTRAVENOUS | Status: DC | PRN

## 2019-12-04 MED ORDER — HEPARIN SODIUM (PORCINE) 1000 UNIT/ML IJ SOLN
INTRAMUSCULAR | Status: DC | PRN
  Administered 2019-12-04: 3500 [IU] via INTRAVENOUS

## 2019-12-04 MED ORDER — SODIUM CHLORIDE 0.9% FLUSH
3.0000 mL | Freq: Two times a day (BID) | INTRAVENOUS | Status: DC
  Administered 2019-12-04 – 2019-12-05 (×3): 3 mL via INTRAVENOUS

## 2019-12-04 MED ORDER — SODIUM CHLORIDE 0.9 % IV SOLN: 250.0000 mL | INTRAVENOUS | Status: DC | PRN

## 2019-12-04 MED ORDER — VERAPAMIL HCL 2.5 MG/ML IV SOLN
INTRAVENOUS | Status: AC
  Filled 2019-12-04: qty 2

## 2019-12-04 MED ORDER — MIDAZOLAM HCL 2 MG/2ML IJ SOLN
INTRAMUSCULAR | Status: DC | PRN
  Administered 2019-12-04: 1 mg via INTRAVENOUS

## 2019-12-04 MED ORDER — POTASSIUM CHLORIDE CRYS ER 10 MEQ PO TBCR: 10.0000 meq | EXTENDED_RELEASE_TABLET | Freq: Every day | ORAL | 3 refills | Status: DC

## 2019-12-04 MED ORDER — FENTANYL CITRATE (PF) 100 MCG/2ML IJ SOLN
INTRAMUSCULAR | Status: AC
  Filled 2019-12-04: qty 2

## 2019-12-04 MED ORDER — SODIUM CHLORIDE 0.9% FLUSH
3.0000 mL | Freq: Two times a day (BID) | INTRAVENOUS | Status: DC
  Administered 2019-12-04: 3 mL via INTRAVENOUS

## 2019-12-04 MED ORDER — LOSARTAN POTASSIUM 25 MG PO TABS: 25.0000 mg | ORAL_TABLET | Freq: Every day | ORAL | 3 refills | Status: DC

## 2019-12-04 MED ORDER — ENOXAPARIN SODIUM 40 MG/0.4ML ~~LOC~~ SOLN
40.0000 mg | SUBCUTANEOUS | Status: DC
  Administered 2019-12-05: 40 mg via SUBCUTANEOUS
  Filled 2019-12-04: qty 0.4

## 2019-12-04 MED ORDER — IOHEXOL 350 MG/ML SOLN
INTRAVENOUS | Status: DC | PRN
  Administered 2019-12-04: 12:00:00 30 mL via INTRA_ARTERIAL

## 2019-12-04 MED ORDER — POTASSIUM CHLORIDE CRYS ER 10 MEQ PO TBCR
10.0000 meq | EXTENDED_RELEASE_TABLET | Freq: Two times a day (BID) | ORAL | Status: DC
  Administered 2019-12-04 – 2019-12-05 (×2): 10 meq via ORAL
  Filled 2019-12-04 (×2): qty 1

## 2019-12-04 MED ORDER — LIDOCAINE HCL (PF) 1 % IJ SOLN
INTRAMUSCULAR | Status: DC | PRN
  Administered 2019-12-04 (×2): 2 mL

## 2019-12-04 MED ORDER — FUROSEMIDE 20 MG PO TABS: 20.0000 mg | ORAL_TABLET | Freq: Every day | ORAL | 3 refills | Status: DC

## 2019-12-04 MED ORDER — FENTANYL CITRATE (PF) 100 MCG/2ML IJ SOLN
INTRAMUSCULAR | Status: DC | PRN
  Administered 2019-12-04: 25 ug via INTRAVENOUS

## 2019-12-04 MED ORDER — MIDAZOLAM HCL 2 MG/2ML IJ SOLN
INTRAMUSCULAR | Status: AC
  Filled 2019-12-04: qty 2

## 2019-12-04 MED ORDER — ASPIRIN 81 MG PO CHEW: 81.0000 mg | CHEWABLE_TABLET | ORAL | Status: DC

## 2019-12-04 MED FILL — POTASSIUM CHL ER M10 TABLET: 10 | 30 days supply | Qty: 30 | Fill #0

## 2019-12-04 MED FILL — FUROSEMIDE 20 MG TAB: 20 | 30 days supply | Qty: 30 | Fill #0

## 2019-12-04 MED FILL — METOPROLOL SUCCINATE ER 25: 25 | 60 days supply | Qty: 30 | Fill #0

## 2019-12-04 SURGICAL SUPPLY — 10 items
CATH 5FR JL3.5 JR4 ANG PIG MP (CATHETERS) ×1 IMPLANT
CATH BALLN WEDGE 5F 110CM (CATHETERS) ×1 IMPLANT
DEVICE RAD TR BAND REGULAR (VASCULAR PRODUCTS) ×1 IMPLANT
GUIDEWIRE INQWIRE 1.5J.035X260 (WIRE) IMPLANT
INQWIRE 1.5J .035X260CM (WIRE) ×2
KIT HEART LEFT (KITS) ×2 IMPLANT
PACK CARDIAC CATHETERIZATION (CUSTOM PROCEDURE TRAY) ×2 IMPLANT
SHEATH GLIDE SLENDER 4/5FR (SHEATH) ×2 IMPLANT
TRANSDUCER W/STOPCOCK (MISCELLANEOUS) ×2 IMPLANT
TUBING CIL FLEX 10 FLL-RA (TUBING) ×2 IMPLANT

## 2019-12-04 NOTE — Progress Notes (Addendum)
Progress Note  Patient Name: Robin Patel Date of Encounter: 12/04/2019  Primary Cardiologist: Dietrich Pates, MD   Subjective   Feeling okay this morning. Occasional fleeting left-sided chest pain. SOB has improve and she is able to lay relatively flat. Also notes improvement in swelling "all over". No complaints of palpitations, dizziness, or lightheadedness.   Inpatient Medications    Scheduled Meds: . famotidine  20 mg Oral BID  . furosemide  20 mg Intravenous BID  . lidocaine-prilocaine   Topical Once  . losartan  25 mg Oral Daily  . metoprolol succinate  12.5 mg Oral Daily  . potassium chloride  20 mEq Oral BID  . sodium chloride flush  3 mL Intravenous Q12H   Continuous Infusions:  PRN Meds:    Vital Signs    Vitals:   12/04/19 0051 12/04/19 0453 12/04/19 0559 12/04/19 0918  BP: 94/72 106/74  107/77  Pulse: 100 96  93  Resp: 20 14 (!) 22   Temp:  97.9 F (36.6 C)    TempSrc:  Oral    SpO2: 94% 98%    Weight:   67.4 kg   Height:        Intake/Output Summary (Last 24 hours) at 12/04/2019 3007 Last data filed at 12/03/2019 2130 Gross per 24 hour  Intake 360 ml  Output 1300 ml  Net -940 ml   Filed Weights   12/02/19 1637 12/03/19 0530 12/04/19 0559  Weight: 69.4 kg 69 kg 67.4 kg    Telemetry    Sinus rhythm with occasional PVCs with bigeminy/trigeminy - Personally Reviewed  ECG    No new tracings - Personally Reviewed  Physical Exam   GEN: Laying in bed in no acute distress.   Neck: No JVD, no carotid bruits Cardiac: RRR, no murmurs, rubs, or gallops.  Respiratory: Clear to auscultation bilaterally, no wheezes/ rales/ rhonchi GI: NABS, Soft, nontender, non-distended  MS: No edema; No deformity. Neuro:  Nonfocal, moving all extremities spontaneously Psych: Normal affect   Labs    Chemistry Recent Labs  Lab 12/01/19 1444 12/01/19 1444 12/02/19 0441 12/03/19 0318 12/04/19 0408  NA 141  --   --  141 139  K 3.8  --   --  3.9 3.9  CL 108   --   --  103 102  CO2 22  --   --  29 27  GLUCOSE 134*  --   --  111* 106*  BUN 16  --   --  18 16  CREATININE 0.65   < > 0.70 0.88 0.89  CALCIUM 8.7*  --   --  8.5* 8.5*  PROT 6.0*  --   --  6.1*  --   ALBUMIN 2.9*  --   --  2.8*  --   AST 132*  --   --  87*  --   ALT 153*  --   --  124*  --   ALKPHOS 127*  --   --  109  --   BILITOT 0.6  --   --  1.1  --   GFRNONAA >60   < > >60 >60 >60  GFRAA >60   < > >60 >60 >60  ANIONGAP 11  --   --  9 10   < > = values in this interval not displayed.     Hematology Recent Labs  Lab 12/01/19 1444 12/02/19 0441 12/04/19 0408  WBC 4.4 4.6 4.9  RBC 4.07 4.66 4.28  HGB 12.6  14.1 13.1  HCT 39.1 44.4 40.9  MCV 96.1 95.3 95.6  MCH 31.0 30.3 30.6  MCHC 32.2 31.8 32.0  RDW 18.0* 17.4* 16.9*  PLT 272 302 279    Cardiac EnzymesNo results for input(s): TROPONINI in the last 168 hours. No results for input(s): TROPIPOC in the last 168 hours.   BNP Recent Labs  Lab 12/01/19 1444  BNP 848.7*     DDimer No results for input(s): DDIMER in the last 168 hours.   Radiology    ECHOCARDIOGRAM COMPLETE  Result Date: 12/02/2019    ECHOCARDIOGRAM REPORT   Patient Name:   Kylar Ruest Date of Exam: 12/02/2019 Medical Rec #:  [id number]      Height:       63.0 in Accession #:    [id number]     Weight:       154.3 lb Date of Birth:  Sep 26, 1950      BSA:          1.732 m Patient Age:    44 years       BP:           114/85 mmHg Patient Gender: F              HR:           98 bpm. Exam Location:  Inpatient Procedure: 2D Echo Indications:    Dyspnea 786.09 / R06.00  History:        Patient has no prior history of Echocardiogram examinations.                 Signs/Symptoms:Dyspnea; Risk Factors:Family History of Coronary                 Artery Disease. Elevated LFTs. Fluid overload.  Sonographer:    Darlina Sicilian RDCS Referring Phys: [id number] [place]  1. Left ventricular ejection fraction, by estimation, is <20%. The left ventricle has  severely decreased function. The left ventricle demonstrates global hypokinesis. The left ventricular internal cavity size was moderately dilated. Left ventricular diastolic parameters are indeterminate.  2. Right ventricular systolic function is normal. The right ventricular size is normal.  3. There is RA diastolic collapse but no significant variability in mitral or tricuspid inflow velocties with respiratory variation. Not suggestive of tamponade.. Moderate pericardial effusion. Moderate pleural effusion in both left and right lateral regions.  4. The mitral valve is normal in structure. Mild mitral valve regurgitation. No evidence of mitral stenosis.  5. The aortic valve is tricuspid. Aortic valve regurgitation is trivial. No aortic stenosis is present.  6. The inferior vena cava is dilated in size with <50% respiratory variability, suggesting right atrial pressure of 15 mmHg. Comparison(s): No prior Echocardiogram. Conclusion(s)/Recommendation(s): Severely reduced LVEF, <20% with global hypokinesis. There is a moderate pericardial effusion with RA collapse, but no respiratory variation. Not suggestive of tamponade, recommend clinical correlation. Significant pleural effusions and ascites also seen. FINDINGS  Left Ventricle: Left ventricular ejection fraction, by estimation, is <20%. The left ventricle has severely decreased function. The left ventricle demonstrates global hypokinesis. The left ventricular internal cavity size was moderately dilated. There is no left ventricular hypertrophy. Left ventricular diastolic parameters are indeterminate. Right Ventricle: The right ventricular size is normal. No increase in right ventricular wall thickness. Right ventricular systolic function is normal. Left Atrium: Left atrial size was normal in size. Right Atrium: Right atrial size was normal in size. Pericardium: There is RA diastolic collapse but no significant variability in mitral  or tricuspid inflow velocties  with respiratory variation. Not suggestive of tamponade. A moderately sized pericardial effusion is present. There is diastolic collapse of  the right atrial wall. Mitral Valve: The mitral valve is normal in structure. Mild mitral valve regurgitation. No evidence of mitral valve stenosis. Tricuspid Valve: The tricuspid valve is normal in structure. Tricuspid valve regurgitation is trivial. No evidence of tricuspid stenosis. Aortic Valve: The aortic valve is tricuspid. Aortic valve regurgitation is trivial. No aortic stenosis is present. Pulmonic Valve: The pulmonic valve was not well visualized. Pulmonic valve regurgitation is not visualized. No evidence of pulmonic stenosis. Aorta: The aortic root and ascending aorta are structurally normal, with no evidence of dilitation. Venous: The inferior vena cava is dilated in size with less than 50% respiratory variability, suggesting right atrial pressure of 15 mmHg. IAS/Shunts: No atrial level shunt detected by color flow Doppler. Additional Comments: There is a moderate pleural effusion in both left and right lateral regions. Moderate ascites is present.  LEFT VENTRICLE PLAX 2D LVIDd:         5.30 cm LVIDs:         4.90 cm LV PW:         0.81 cm LV IVS:        0.89 cm LVOT diam:     1.90 cm LV SV:         39 LV SV Index:   23 LVOT Area:     2.84 cm  LV Volumes (MOD) LV vol d, MOD A2C: 195.0 ml LV vol d, MOD A4C: 201.0 ml LV vol s, MOD A2C: 111.0 ml LV vol s, MOD A4C: 142.0 ml LV SV MOD A2C:     84.0 ml LV SV MOD A4C:     201.0 ml LV SV MOD BP:      68.7 ml RIGHT VENTRICLE RV S prime:     14.90 cm/s TAPSE (M-mode): 2.3 cm LEFT ATRIUM             Index       RIGHT ATRIUM           Index LA diam:        3.80 cm 2.19 cm/m  RA Area:     12.80 cm LA Vol (A2C):   54.7 ml 31.58 ml/m RA Volume:   27.70 ml  15.99 ml/m LA Vol (A4C):   43.9 ml 25.35 ml/m LA Biplane Vol: 50.7 ml 29.27 ml/m  AORTIC VALVE LVOT Vmax:   109.00 cm/s LVOT Vmean:  70.000 cm/s LVOT VTI:    0.139 m   AORTA Ao Root diam: 2.50 cm MITRAL VALVE MV Area (PHT): 4.89 cm     SHUNTS MV Decel Time: 155 msec     Systemic VTI:  0.14 m MV E velocity: 90.80 cm/s   Systemic Diam: 1.90 cm MV A velocity: 105.00 cm/s MV E/A ratio:  0.86 Jodelle Red MD Electronically signed by Jodelle Red MD Signature Date/Time: 12/02/2019/3:04:18 PM    Final     Cardiac Studies   1. Left ventricular ejection fraction, by estimation, is <20%. The left  ventricle has severely decreased function. The left ventricle demonstrates  global hypokinesis. The left ventricular internal cavity size was  moderately dilated. Left ventricular  diastolic parameters are indeterminate.  2. Right ventricular systolic function is normal. The right ventricular  size is normal.  3. There is RA diastolic collapse but no significant variability in  mitral or tricuspid inflow velocties with respiratory variation. Not  suggestive of tamponade.. Moderate pericardial effusion. Moderate pleural  effusion in both left and right lateral  regions.  4. The mitral valve is normal in structure. Mild mitral valve  regurgitation. No evidence of mitral stenosis.  5. The aortic valve is tricuspid. Aortic valve regurgitation is trivial.  No aortic stenosis is present.  6. The inferior vena cava is dilated in size with <50% respiratory  variability, suggesting right atrial pressure of 15 mmHg.   Patient Profile     69 y.o. female with no significant PMH, though patient avoided medical care in the past, now here with SOB, orthopnea, and LE edema, found to have acute combined CHF, for which cardiology is following.   Assessment & Plan    1. Acute combined CHF: patient presented with progressive SOB, PND, orthopnea, and LE edema. BNP 800s on admission. CXR with b/l pleural effusions. HsTrop 44 x2. EKG without TWI in inferolateral leads. Echo this admission revealed EF <20% with global hypokinesis, moderately dilated LV, and  indeterminate LV diastolic function with a moderate pericardial effusion, moderate b/l pleural effusions, mild MR, and elevated RA pressures. She was started on IV lasix 20mg  BID with UOP net - in the past 24 hours with a couple unmeasured urine occurrences (no intake documented for the first 2 days making net output during admission inaccurate). Weight is down to 148lbs from 154lbs on admission. Etiology of her CHF is unclear at this time, though recommended for [facility] to determine reversible causes of her CHF were discussed with patient/family yesterday; they wished to think about it overnight.  - Agreeable to [facility] today - Favor continued IV lasix for now - will assess volume status with RHC today - anticipate transition to po in the next 24 hours - Continue metoprolol succinate and losartan - BP is soft, though could consider transition to entresto prior to discharge if BP will allow.  - Continue to monitor daily weights and strict I&Os  2. Pericardial effusion: moderate on echo this admission. Hopeful this will improve with diuresis. No evidence of tamponade  - - Continue IV lasix as above  3. Transaminitis: suspect 2/2 hepatic congestion related to #1 - Continue to monitor    For questions or updates, please contact [facility] Please consult [email or web address] for contact info under Cardiology/STEMI.      Signed, [facility], PA-C  12/04/2019, 9:28 AM   [phone]  Pt seen and examined  AGree with findings as noted by [phone] above Reviewed with pt and husband yesterday about catherizatoin   Risks / benefits explained  Patient has reflect and today agrees to proceed  On exam:  Pt is comfortable flat in bed Lungs are CTA Cardiac RRR  + S3    Ext are without edema  Keep on same meds  WIll titrate after cath   Irving Shows MD

## 2019-12-04 NOTE — Progress Notes (Signed)
ReDS Clip Diuretic Study Pt study # [id number]  Your patient is in the Blinded arm of the ReDS Clip Diuretic study.  Your patient has had a ReDS reading and the reading has been transmitted to the cloud.   Thank You,    The Research Team  Pacen Watford, PharmD, BCCCP Clinical Pharmacist  Phone: [phone]  Please check AMION for all [facility] Pharmacy phone numbers After 10:00 PM, call Main Pharmacy [phone]   

## 2019-12-04 NOTE — Progress Notes (Signed)
R and L heart cath:  Normal coronary arteries    R heart cath :   RVEDP 7  PAP 39/19  PCWP 17 mm  Hg   LVEDP 18     Plan:    Medical Rx for CHF    On b blocker low dose and Losartan low dose   BP is fragile   She will probably need oral dose of lasix daily and have close outpt f/u  Would recomm lasix 20 with 10 KCL  WIll arrange for BMET next Wed    F?U with Wende Mott in a couple weeks.   Dietrich Pates MD

## 2019-12-04 NOTE — Plan of Care (Signed)
  Problem: Education: Goal: Ability to demonstrate management of disease process will improve 12/04/2019 2100 by Bennie Pierini, RN Outcome: Progressing 12/04/2019 2100 by Bennie Pierini, RN Outcome: Progressing Goal: Ability to verbalize understanding of medication therapies will improve 12/04/2019 2100 by Bennie Pierini, RN Outcome: Progressing 12/04/2019 2100 by Bennie Pierini, RN Outcome: Progressing Goal: Individualized Educational Video(s) 12/04/2019 2100 by Bennie Pierini, RN Outcome: Progressing 12/04/2019 2100 by Bennie Pierini, RN Outcome: Progressing   Problem: Activity: Goal: Capacity to carry out activities will improve 12/04/2019 2100 by Bennie Pierini, RN Outcome: Progressing 12/04/2019 2100 by Bennie Pierini, RN Outcome: Progressing   Problem: Cardiac: Goal: Ability to achieve and maintain adequate cardiopulmonary perfusion will improve 12/04/2019 2100 by Bennie Pierini, RN Outcome: Progressing 12/04/2019 2100 by Bennie Pierini, RN Outcome: Progressing Patient states dizziness is better. Was able to ambulate to the restroom and back without issue.
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
## 2019-12-04 NOTE — Interval H&P Note (Signed)
History and Physical Interval Note:  12/04/2019 10:54 AM  Robin Patel  has presented today for surgery, with the diagnosis of heart failure.  The various methods of treatment have been discussed with the patient and family. After consideration of risks, benefits and other options for treatment, the patient has consented to  Procedure(s): RIGHT/LEFT HEART CATH AND CORONARY ANGIOGRAPHY (N/A) as a surgical intervention.  The patient's history has been reviewed, patient examined, no change in status, stable for surgery.  I have reviewed the patient's chart and labs.  Questions were answered to the patient's satisfaction.   Cath Lab Visit (complete for each Cath Lab visit)  Clinical Evaluation Leading to the Procedure:   ACS: No.  Non-ACS:    Anginal Classification: No Symptoms  Anti-ischemic medical therapy: Minimal Therapy (1 class of medications)  Non-Invasive Test Results: High-risk stress test findings: cardiac mortality >3%/year  Prior CABG: No previous CABG        Theron Arista [facility] 12/04/2019 10:55 AM

## 2019-12-05 MED ORDER — POTASSIUM CHLORIDE CRYS ER 10 MEQ PO TBCR: 10.0000 meq | EXTENDED_RELEASE_TABLET | Freq: Every day | ORAL | Status: DC

## 2019-12-05 NOTE — Discharge Summary (Signed)
Physician Discharge Summary  Robin Patel FVC:[id number] DOB: 18-Apr-1951 DOA: 12/01/2019  PCP: Hoy Register, MD  Admit date: 12/01/2019 Discharge date: 12/05/2019  Time spent: 35 minutes  Recommendations for Outpatient Follow-up:  Cardiology, [facility] on 4/17, please check BMP at follow-up  Discharge Diagnoses:  Principal Problem: Acute systolic CHF Moderate pericardial effusion Needle phobia   Dyspnea and respiratory abnormalities   Elevated LFTs   SOB (shortness of breath)   Acute congestive heart failure [facility])   Discharge Condition: Improved  Diet recommendation: Low-sodium, heart healthy  Filed Weights   12/03/19 0530 12/04/19 0559 12/05/19 0500  Weight: 69 kg 67.4 kg 67.1 kg    History of present illness:  Robin Patel is a 69 y.o. F with no past medical history, but health system avoidance per her husband who presents with several days progressive shortness of breath, orthopnea, ankle swelling. BNP 848, chest x-ray showed moderate bilateral pleural effusions.  Patient was started on IV Lasix and the hospitalist were called in for congestive heart failure.  Hospital Course:   Acute hypoxic respiratory failure due to CHF New onset acute systolic congestive heart failure Non-ischemic cardiomyopathy, new -Admitted with new onset CHF, echo noted EF of 20 to 25%, mild to moderate pericardial effusion, moderate LV enlargement, mild mitral regurg. -Cardiology consulted, diuresed with IV Lasix -Underwent left and right heart cath: Noted normal coronaries -Transitioned to low-dose Toprol and Lasix -Her blood pressure is in the 90s and limits additional cardiac medications -Seen by cardiology today and felt to be stable for discharge, they have recommended outpatient cardiac MRI and repeat echo to reassess for pericardial effusion -Cardiology follow-up made in 2 weeks  Transaminitis Congestive hepatopathy, improving  Needle phobia This appears to be  significant for this patient, to the extent that she is willing to forego all treatment of congestive heart failure, and ischemic work-up because of fear of needles.   -All efforts to avoid needle sticks, or to use a cream or equivalent should be used    Discharge Exam: Vitals:   12/05/19 0500 12/05/19 0904  BP: 92/65 (!) 95/54  Pulse:  91  Resp: 18 (!) 22  Temp:  98.4 F (36.9 C)  SpO2:  99%    General: AAOx3 Cardiovascular: S1S2/RRR Respiratory: CTAB  Discharge Instructions   Discharge Instructions    (HEART FAILURE PATIENTS) Call MD:  Anytime you have any of the following symptoms: 1) 3 pound weight gain in 24 hours or 5 pounds in 1 week 2) shortness of breath, with or without a dry hacking cough 3) swelling in the hands, feet or stomach 4) if you have to sleep on extra pillows at night in order to breathe.   Complete by: As directed    Avoid straining   Complete by: As directed    Diet - low sodium heart healthy   Complete by: As directed    Diet - low sodium heart healthy   Complete by: As directed    Discharge instructions   Complete by: As directed    Take the new heart medicines: Take metoprolol 12.5 mg daily Take losartan 25 mg daily Take furosemide 20 mg daily Take potassium 10 mEq daily  Go see Tereso Newcomer (Dr. Charlott Rakes assistance in 2 weeks as arranged)   Heart Failure patients record your daily weight using the same scale at the same time of day   Complete by: As directed    Increase activity slowly   Complete by: As directed  Increase activity slowly   Complete by: As directed    STOP any activity that causes chest pain, shortness of breath, dizziness, sweating, or exessive weakness   Complete by: As directed      Allergies as of 12/05/2019   No Known Allergies     Medication List    STOP taking these medications   aspirin 325 MG EC tablet     TAKE these medications   bismuth subsalicylate 130 QM/57QI suspension Commonly known as: PEPTO  BISMOL Take 30 mLs by mouth every 6 (six) hours as needed for indigestion or diarrhea or loose stools.   famotidine 20 MG tablet Commonly known as: PEPCID Take 1 tablet (20 mg total) by mouth 2 (two) times daily.   furosemide 20 MG tablet Commonly known as: LASIX Take 1 tablet (20 mg total) by mouth daily.   guaiFENesin 600 MG 12 hr tablet Commonly known as: MUCINEX Take 600 mg by mouth 2 (two) times daily as needed for cough.   metoprolol succinate 25 MG 24 hr tablet Commonly known as: TOPROL-XL Take 0.5 tablets (12.5 mg total) by mouth daily.   multivitamin with minerals tablet Take 1 tablet by mouth daily.   potassium chloride 10 MEQ tablet Commonly known as: KLOR-CON Take 1 tablet (10 mEq total) by mouth daily.   Simethicone 250 MG Caps Take 1 capsule by mouth 2 (two) times daily as needed.            Durable Medical Equipment  (From admission, onward)         Start     Ordered   12/05/19 0936  For home use only DME Walker rolling  Once    Question Answer Comment  Walker: With 5 Inch Wheels   Patient needs a walker to treat with the following condition Dyspnea      12/05/19 0936         No Known Allergies Follow-up Information    Liliane Shi, PA-C Follow up on 12/16/2019.   Specialties: Cardiology, Physician Assistant Why: Please arrive 15 minutes early for your 10:45am post-hospital cardiology follow-up appointment Contact information: [address] [address] [phone]        [place] [facility] Follow up on 12/09/2019.   Specialty: Cardiology Why: Please arrive at your convenience between [place] for blood work for close outpatient monitoring of your kidney function and electrolytes.  Contact information: [address], [place] [phone]           The results of significant diagnostics from this hospitalization (including imaging,  microbiology, ancillary and laboratory) are listed below for reference.    Significant Diagnostic Studies: DG Chest 2 View  Result Date: 12/01/2019 CLINICAL DATA:  Orthopnea. Shortness of breath. EXAM: CHEST - 2 VIEW COMPARISON:  September 28, 2011 FINDINGS: The heart size is enlarged. Aortic calcifications are noted. There are moderate-sized bilateral pleural effusions with adjacent compressive atelectasis. There are prominent interstitial lung markings with Kerley B lines. There is no pneumothorax. No acute osseous abnormality. IMPRESSION: Findings consistent with CHF with moderate-sized bilateral pleural effusions and adjacent compressive atelectasis. Electronically Signed   By: Constance Holster M.D.   On: 12/01/2019 15:14   DG Abd 1 View  Result Date: 12/01/2019 CLINICAL DATA:  Shortness of breath. Abdominal cramping. EXAM: ABDOMEN - 1 VIEW COMPARISON:  None. FINDINGS: There is no evidence for small bowel obstruction. There is a moderate amount of  stool in the colon. There is evidence for hepatosplenomegaly with the liver measuring approximately 23 cm craniocaudad in the spleen measuring approximately 13 cm craniocaudad. There are no radiopaque kidney stones. No pneumatosis. No free air. IMPRESSION: 1. Hepatosplenomegaly. 2. Moderate amount of stool in the colon. 3. No evidence for small bowel obstruction. Electronically Signed   By: Katherine Mantle M.D.   On: 12/01/2019 15:15   CARDIAC CATHETERIZATION  Result Date: 12/04/2019  LV end diastolic pressure is mildly elevated.  Hemodynamic findings consistent with mild pulmonary hypertension.  1. Normal coronary anatomy 2. Mildly elevated LV filling pressures. 18 mm Hg 3. Mild pulmonary HTN mean of 27 mm Hg 4. Cardiac index 2.67. Plan: medical management.   ECHOCARDIOGRAM COMPLETE  Result Date: 12/02/2019    ECHOCARDIOGRAM REPORT   Patient Name:   Robin Patel Date of Exam: 12/02/2019 Medical Rec #:  [id number]      Height:       63.0 in  Accession #:    [id number]     Weight:       154.3 lb Date of Birth:  [birth year]      BSA:          1.732 m Patient Age:    68 years       BP:           114/85 mmHg Patient Gender: F              HR:           98 bpm. Exam Location:  Inpatient Procedure: 2D Echo Indications:    Dyspnea 786.09 / R06.00  History:        Patient has no prior history of Echocardiogram examinations.                 Signs/Symptoms:Dyspnea; Risk Factors:Family History of Coronary                 Artery Disease. Elevated LFTs. Fluid overload.  Sonographer:    Leta Jungling RDCS Referring Phys: [id number] CHELSEA N FAIR IMPRESSIONS  1. Left ventricular ejection fraction, by estimation, is <20%. The left ventricle has severely decreased function. The left ventricle demonstrates global hypokinesis. The left ventricular internal cavity size was moderately dilated. Left ventricular diastolic parameters are indeterminate.  2. Right ventricular systolic function is normal. The right ventricular size is normal.  3. There is RA diastolic collapse but no significant variability in mitral or tricuspid inflow velocties with respiratory variation. Not suggestive of tamponade.. Moderate pericardial effusion. Moderate pleural effusion in both left and right lateral regions.  4. The mitral valve is normal in structure. Mild mitral valve regurgitation. No evidence of mitral stenosis.  5. The aortic valve is tricuspid. Aortic valve regurgitation is trivial. No aortic stenosis is present.  6. The inferior vena cava is dilated in size with <50% respiratory variability, suggesting right atrial pressure of 15 mmHg. Comparison(s): No prior Echocardiogram. Conclusion(s)/Recommendation(s): Severely reduced LVEF, <20% with global hypokinesis. There is a moderate pericardial effusion with RA collapse, but no respiratory variation. Not suggestive of tamponade, recommend clinical correlation. Significant pleural effusions and ascites also seen. FINDINGS  Left Ventricle:  Left ventricular ejection fraction, by estimation, is <20%. The left ventricle has severely decreased function. The left ventricle demonstrates global hypokinesis. The left ventricular internal cavity size was moderately dilated. There is no left ventricular hypertrophy. Left ventricular diastolic parameters are indeterminate. Right Ventricle: The right ventricular size is normal. No increase in right ventricular wall thickness.  Right ventricular systolic function is normal. Left Atrium: Left atrial size was normal in size. Right Atrium: Right atrial size was normal in size. Pericardium: There is RA diastolic collapse but no significant variability in mitral or tricuspid inflow velocties with respiratory variation. Not suggestive of tamponade. A moderately sized pericardial effusion is present. There is diastolic collapse of  the right atrial wall. Mitral Valve: The mitral valve is normal in structure. Mild mitral valve regurgitation. No evidence of mitral valve stenosis. Tricuspid Valve: The tricuspid valve is normal in structure. Tricuspid valve regurgitation is trivial. No evidence of tricuspid stenosis. Aortic Valve: The aortic valve is tricuspid. Aortic valve regurgitation is trivial. No aortic stenosis is present. Pulmonic Valve: The pulmonic valve was not well visualized. Pulmonic valve regurgitation is not visualized. No evidence of pulmonic stenosis. Aorta: The aortic root and ascending aorta are structurally normal, with no evidence of dilitation. Venous: The inferior vena cava is dilated in size with less than 50% respiratory variability, suggesting right atrial pressure of 15 mmHg. IAS/Shunts: No atrial level shunt detected by color flow Doppler. Additional Comments: There is a moderate pleural effusion in both left and right lateral regions. Moderate ascites is present.  LEFT VENTRICLE PLAX 2D LVIDd:         5.30 cm LVIDs:         4.90 cm LV PW:         0.81 cm LV IVS:        0.89 cm LVOT diam:     1.90  cm LV SV:         39 LV SV Index:   23 LVOT Area:     2.84 cm  LV Volumes (MOD) LV vol d, MOD A2C: 195.0 ml LV vol d, MOD A4C: 201.0 ml LV vol s, MOD A2C: 111.0 ml LV vol s, MOD A4C: 142.0 ml LV SV MOD A2C:     84.0 ml LV SV MOD A4C:     201.0 ml LV SV MOD BP:      68.7 ml RIGHT VENTRICLE RV S prime:     14.90 cm/s TAPSE (M-mode): 2.3 cm LEFT ATRIUM             Index       RIGHT ATRIUM           Index LA diam:        3.80 cm 2.19 cm/m  RA Area:     12.80 cm LA Vol (A2C):   54.7 ml 31.58 ml/m RA Volume:   27.70 ml  15.99 ml/m LA Vol (A4C):   43.9 ml 25.35 ml/m LA Biplane Vol: 50.7 ml 29.27 ml/m  AORTIC VALVE LVOT Vmax:   109.00 cm/s LVOT Vmean:  70.000 cm/s LVOT VTI:    0.139 m  AORTA Ao Root diam: 2.50 cm MITRAL VALVE MV Area (PHT): 4.89 cm     SHUNTS MV Decel Time: 155 msec     Systemic VTI:  0.14 m MV E velocity: 90.80 cm/s   Systemic Diam: 1.90 cm MV A velocity: 105.00 cm/s MV E/A ratio:  0.86 Jodelle Red MD Electronically signed by Jodelle Red MD Signature Date/Time: 12/02/2019/3:04:18 PM    Final    US Abdomen Limited RUQ  Result Date: 12/02/2019 CLINICAL DATA:  Elevated transaminase EXAM: ULTRASOUND ABDOMEN LIMITED RIGHT UPPER QUADRANT COMPARISON:  None. FINDINGS: Gallbladder: No gallstones or wall thickening visualized. No sonographic Murphy sign noted by sonographer. Common bile duct: Diameter: 4 mm.  Where visualized, no  filling defect. Liver: No focal lesion identified. Within normal limits in parenchymal echogenicity. Portal vein is patent on color Doppler imaging with normal direction of blood flow towards the liver. Other: Right pleural effusion. Distended appearance of the IVC on sagittal images IMPRESSION: 1. Negative liver and gallbladder. 2. Right pleural effusion and distended appearance of the IVC, question right heart failure. Electronically Signed   By: Marnee Spring M.D.   On: 12/02/2019 04:55    Microbiology: Recent Results (from the past 240 hour(s))   SARS CORONAVIRUS 2 (TAT 6-24 HRS) Nasopharyngeal Nasopharyngeal Swab     Status: None   Collection Time: 12/01/19  2:33 PM   Specimen: Nasopharyngeal Swab  Result Value Ref Range Status   SARS Coronavirus 2 NEGATIVE NEGATIVE Final    Comment: (NOTE) SARS-CoV-2 target nucleic acids are NOT DETECTED. The SARS-CoV-2 RNA is generally detectable in upper and lower respiratory specimens during the acute phase of infection. Negative results do not preclude SARS-CoV-2 infection, do not rule out co-infections with other pathogens, and should not be used as the sole basis for treatment or other patient management decisions. Negative results must be combined with clinical observations, patient history, and epidemiological information. The expected result is Negative. Fact Sheet for Patients: HairSlick.no Fact Sheet for Healthcare Providers: quierodirigir.com This test is not yet approved or cleared by the [place] FDA and  has been authorized for detection and/or diagnosis of SARS-CoV-2 by FDA under an Emergency Use Authorization (EUA). This EUA will remain  in effect (meaning this test can be used) for the duration of the COVID-19 declaration under Section 56 4(b)(1) of the Act, 21 U.S.C. section 360bbb-3(b)(1), unless the authorization is terminated or revoked sooner. Performed at [facility], [address] [place], [place] [address]   SARS CORONAVIRUS 2 (TAT 6-24 HRS) Nasopharyngeal Nasopharyngeal Swab     Status: None   Collection Time: 12/02/19  4:44 AM   Specimen: Nasopharyngeal Swab  Result Value Ref Range Status   SARS Coronavirus 2 NEGATIVE NEGATIVE Final    Comment: (NOTE) SARS-CoV-2 target nucleic acids are NOT DETECTED. The SARS-CoV-2 RNA is generally detectable in upper and lower respiratory specimens during the acute phase of infection. Negative results do not preclude SARS-CoV-2 infection, do not rule  out co-infections with other pathogens, and should not be used as the sole basis for treatment or other patient management decisions. Negative results must be combined with clinical observations, patient history, and epidemiological information. The expected result is Negative. Fact Sheet for Patients: HairSlick.no Fact Sheet for Healthcare Providers: quierodirigir.com This test is not yet approved or cleared by the [place] FDA and  has been authorized for detection and/or diagnosis of SARS-CoV-2 by FDA under an Emergency Use Authorization (EUA). This EUA will remain  in effect (meaning this test can be used) for the duration of the COVID-19 declaration under Section 56 4(b)(1) of the Act, 21 U.S.C. section 360bbb-3(b)(1), unless the authorization is terminated or revoked sooner. Performed at [facility], [address], [place], [place] [address]      Labs: Basic Metabolic Panel: Recent Labs  Lab 12/01/19 1444 12/02/19 0441 12/03/19 0318 12/04/19 0408 12/04/19 1131 12/04/19 1134  NA 141  --  141 139 141 141  K 3.8  --  3.9 3.9 3.9 3.8  CL 108  --  103 102  --   --   CO2 22  --  29 27  --   --   GLUCOSE 134*  --  111* 106*  --   --   BUN 16  --  18 16  --   --   CREATININE 0.65 0.70 0.88 0.89  --   --   CALCIUM 8.7*  --  8.5* 8.5*  --   --   MG  --  2.0  --   --   --   --    Liver Function Tests: Recent Labs  Lab 12/01/19 1444 12/03/19 0318  AST 132* 87*  ALT 153* 124*  ALKPHOS 127* 109  BILITOT 0.6 1.1  PROT 6.0* 6.1*  ALBUMIN 2.9* 2.8*   Recent Labs  Lab 12/02/19 0441  LIPASE 19   No results for input(s): AMMONIA in the last 168 hours. CBC: Recent Labs  Lab 12/01/19 1444 12/02/19 0441 12/04/19 0408 12/04/19 1131 12/04/19 1134  WBC 4.4 4.6 4.9  --   --   NEUTROABS 1.9  --   --   --   --   HGB 12.6 14.1 13.1 14.6 14.6  HCT 39.1 44.4 40.9 43.0 43.0  MCV 96.1 95.3 95.6  --   --    PLT 272 302 279  --   --    Cardiac Enzymes: No results for input(s): CKTOTAL, CKMB, CKMBINDEX, TROPONINI in the last 168 hours. BNP: BNP (last 3 results) Recent Labs    12/01/19 1444  BNP 848.7*    ProBNP (last 3 results) No results for input(s): PROBNP in the last 8760 hours.  CBG: No results for input(s): GLUCAP in the last 168 hours.     Signed:  Zannie Cove MD.  [facility] 12/05/2019, 3:10 PM

## 2019-12-05 NOTE — Progress Notes (Signed)
12/05/2019 12:59 PM Discharge AVS meds taken today and those due this evening reviewed.  Follow-up appointments and when to call md reviewed.  D/C IV and TELE.  Questions and concerns addressed.   D/C home per orders. Kathryne Hitch

## 2019-12-05 NOTE — Progress Notes (Addendum)
Progress Note  Patient Name: Robin Patel Date of Encounter: 12/05/2019  Primary Cardiologist: Dietrich Pates, MD   Subjective   No CP or dyspnea; dizzy yesterday and losartan DCed.  Inpatient Medications    Scheduled Meds: . enoxaparin (LOVENOX) injection  40 mg Subcutaneous Q24H  . famotidine  20 mg Oral BID  . [START ON 12/06/2019] furosemide  20 mg Oral Daily  . lidocaine-prilocaine   Topical Once  . metoprolol succinate  12.5 mg Oral Daily  . potassium chloride  10 mEq Oral BID  . sodium chloride flush  3 mL Intravenous Q12H  . sodium chloride flush  3 mL Intravenous Q12H   Continuous Infusions: . sodium chloride     PRN Meds: sodium chloride, sodium chloride flush   Vital Signs    Vitals:   12/04/19 2031 12/05/19 0400 12/05/19 0500 12/05/19 0904  BP: 92/63 91/76 92/65  (!) 95/54  Pulse: 97   91  Resp: 20 (!) 25 18 (!) 22  Temp: 98.8 F (37.1 C)   98.4 F (36.9 C)  TempSrc: Oral   Oral  SpO2: 95% 96%  99%  Weight:   67.1 kg   Height:        Intake/Output Summary (Last 24 hours) at 12/05/2019 0917 Last data filed at 12/05/2019 0849 Gross per 24 hour  Intake 123 ml  Output 600 ml  Net -477 ml   Last 3 Weights 12/05/2019 12/04/2019 12/03/2019  Weight (lbs) 147 lb 14.4 oz 148 lb 9.6 oz 152 lb 1.9 oz  Weight (kg) 67.087 kg 67.405 kg 69 kg      Telemetry    Sinus with PVCs - Personally Reviewed   Physical Exam   GEN: No acute distress.   Neck: No JVD Cardiac: RRR, no murmurs, rubs, or gallops.  Respiratory: Clear to auscultation bilaterally. GI: Soft, nontender, non-distended  MS: No edema; Radial cath site with no hematoma Neuro:  Nonfocal  Psych: Normal affect   Labs    High Sensitivity Troponin:   Recent Labs  Lab 12/02/19 0441  TROPONINIHS 44*  44*      Chemistry Recent Labs  Lab 12/01/19 1444 12/01/19 1444 12/02/19 0441 12/03/19 0318 12/03/19 0318 12/04/19 0408 12/04/19 1131 12/04/19 1134  NA 141   < >  --  141   < > 139 141 141  K  3.8   < >  --  3.9   < > 3.9 3.9 3.8  CL 108  --   --  103  --  102  --   --   CO2 22  --   --  29  --  27  --   --   GLUCOSE 134*  --   --  111*  --  106*  --   --   BUN 16  --   --  18  --  16  --   --   CREATININE 0.65   < > 0.70 0.88  --  0.89  --   --   CALCIUM 8.7*  --   --  8.5*  --  8.5*  --   --   PROT 6.0*  --   --  6.1*  --   --   --   --   ALBUMIN 2.9*  --   --  2.8*  --   --   --   --   AST 132*  --   --  87*  --   --   --   --  ALT 153*  --   --  124*  --   --   --   --   ALKPHOS 127*  --   --  109  --   --   --   --   BILITOT 0.6  --   --  1.1  --   --   --   --   GFRNONAA >60   < > >60 >60  --  >60  --   --   GFRAA >60   < > >60 >60  --  >60  --   --   ANIONGAP 11  --   --  9  --  10  --   --    < > = values in this interval not displayed.     Hematology Recent Labs  Lab 12/01/19 1444 12/01/19 1444 12/02/19 0441 12/02/19 0441 12/04/19 0408 12/04/19 1131 12/04/19 1134  WBC 4.4  --  4.6  --  4.9  --   --   RBC 4.07  --  4.66  --  4.28  --   --   HGB 12.6   < > 14.1   < > 13.1 14.6 14.6  HCT 39.1   < > 44.4   < > 40.9 43.0 43.0  MCV 96.1  --  95.3  --  95.6  --   --   MCH 31.0  --  30.3  --  30.6  --   --   MCHC 32.2  --  31.8  --  32.0  --   --   RDW 18.0*  --  17.4*  --  16.9*  --   --   PLT 272  --  302  --  279  --   --    < > = values in this interval not displayed.    BNP Recent Labs  Lab 12/01/19 1444  BNP 848.7*    Radiology    CARDIAC CATHETERIZATION  Result Date: 03/07/6432  LV end diastolic pressure is mildly elevated.  Hemodynamic findings consistent with mild pulmonary hypertension.  1. Normal coronary anatomy 2. Mildly elevated LV filling pressures. 18 mm Hg 3. Mild pulmonary HTN mean of 27 mm Hg 4. Cardiac index 2.67. Plan: medical management.    Patient Profile     69 y.o. female admitted with acute systolic congestive heart failure.  Echocardiogram interpreted as ejection fraction less than 20%, moderate left ventricular  enlargement, moderate pericardial effusion, mild mitral regurgitation.  I have personally reviewed the patient's echocardiogram and her pericardial effusion is small.  Cardiac catheterization shows normal coronary arteries and mildly elevated left ventricular end-diastolic pressure of 18.  Assessment & Plan    1 acute systolic congestive heart failure-patient is euvolemic this morning.  Would continue Lasix at present dose.  Change kdur to 10 meq daily.  Needs fluid restriction and low-sodium diet at home.  2 cardiomyopathy-etiology unclear.  No history of alcohol abuse.  Catheterization revealed no coronary disease.  TSH is normal.  Would continue low-dose Toprol.  Losartan was discontinued yesterday as she was dizzy and blood pressure was low.  We will arrange follow-up in the CHF clinic to see if medications can be titrated with addition of ARB and spironolactone.  Will also need outpatient cardiac MRI.  Once medications fully titrated we will plan repeat echocardiogram and if ejection fraction less than 35% would need to consider ICD.  3 pericardial effusion-I have personally reviewed the patient's echocardiogram and it  appears to be small.  Would repeat echocardiogram in 2 to 4 weeks.  4 elevated liver functions-likely secondary to passive congestion.  Will need follow-up as an outpatient.  Patient can be discharged from a cardiac standpoint.  Continue present cardiac medications.  We will arrange follow-up in CHF clinic in 1 to 2 weeks for medication titration.  We will arrange a bmet in 1 week.  She will need an outpatient cardiac MRI.  Repeat echocardiogram in 4 weeks for pericardial effusion.  Follow-up Dr. Tenny Craw 8 to 12 weeks.  For questions or updates, please contact [facility] Please consult [email or web address] for contact info under        Signed, Olga Millers, MD  12/05/2019, 9:17 AM

## 2019-12-05 NOTE — Progress Notes (Signed)
ReDS Clip Diuretic Study Pt study # [id number]  Your patient is in the Blinded arm of the ReDS Clip Diuretic study.  Your patient has had a ReDS reading and the reading has been transmitted to the cloud.   Thank You,    The Research Team  Sherron Monday, PharmD, BCCCP Clinical Pharmacist  Phone: [phone]  Please check AMION for all [facility] phone numbers After 10:00 PM, call Main Pharmacy [phone]

## 2019-12-07 ENCOUNTER — Telehealth: Payer: Self-pay

## 2019-12-07 NOTE — Telephone Encounter (Signed)
From the discharge call:     she was focused on the need to follow up with cardiology and was not sure about scheduling an appointment with PCP at this time. She has not been to the [facility] in a year and said that she does not have another PCP.  She said she would call the clinic to schedule after she has seen cardiology.      Said she was supposed to have a RW ordered but has not yet received it.

## 2019-12-07 NOTE — Telephone Encounter (Signed)
TCM Call: 1st attempt No answer so I left a message on the pts VM asking her to call us back at [phone].

## 2019-12-07 NOTE — Telephone Encounter (Signed)
Transition Care Management Follow-up Telephone Call  Date of discharge and from where: 12/05/2019, [facility]   How have you been since you were released from the hospital?  She is doing okay.  Coughing at times during the call.   Any questions or concerns?  she was focused on the need to follow up with cardiology and was not sure about scheduling an appointment with PCP at this time. She has not been to the [facility] in a year and said that she does not have another PCP.  She said she would call the clinic to schedule after she has seen cardiology.    Items Reviewed:  Did the pt receive and understand the discharge instructions provided?  yes she said she has the instructions and did not have any questions.   Medications obtained and verified? she said that she has all medications, including the new ones and did not have any questions.   Any new allergies since your discharge?  none reported  Do you have support at home? yes, husband  Other (ie: DME, Home Health, etc)no home health ordered.  Said she was supposed to have a RW ordered but has not yet received it.   Just purchased a scale. Stated that she understands that she is to weigh herself every morning and log the results.   Functional Questionnaire: (I = Independent and D = Dependent) ADL's: independent. Family assists as needed   Follow up appointments reviewed:    PCP Hospital f/u appt confirmed?.did not want to schedule an appointment at this time   [facility] f/u appt confirmed?  cardiac surgery - 12/16/2019.cardiology - 12/17/2019,   Are transportation arrangements needed? no, husband drives  If their condition worsens, is the pt aware to call  their PCP or go to the ED? yes  Was the patient provided with contact information for the PCP's office or ED?  yes, she has the phone number for the clinic  Was the pt encouraged to call back with questions or concerns?  yes

## 2019-12-08 ENCOUNTER — Ambulatory Visit

## 2019-12-08 ENCOUNTER — Ambulatory Visit: Payer: Medicare Other | Attending: Internal Medicine

## 2019-12-08 DIAGNOSIS — Z23 Encounter for immunization: Secondary | ICD-10-CM

## 2019-12-08 NOTE — Telephone Encounter (Signed)
**Note De-Identified Conrado Nance Obfuscation** TCM Call 2nd Attempt: No answer so I left a message asking the pt to call us back. I did leave the office phone number and a reminder that she has a hospital f/u scheduled with Tereso Newcomer, PA-c on 12/16/2019 at 10:45 at [address], [address] in [place], [place], [address].

## 2019-12-08 NOTE — Progress Notes (Signed)
   Covid-19 Vaccination Clinic  Name:  Robin Patel    MRN: [id number] DOB: 1951/06/13  12/08/2019  Robin Patel was observed post Covid-19 immunization for 15 minutes without incident. She was provided with Vaccine Information Sheet and instruction to access the V-Safe system.   Robin Patel was instructed to call 911 with any severe reactions post vaccine: Marland Kitchen Difficulty breathing  . Swelling of face and throat  . A fast heartbeat  . A bad rash all over body  . Dizziness and weakness   Immunizations Administered    Name Date Dose VIS Date Route   Pfizer COVID-19 Vaccine 12/08/2019  3:14 PM 0.3 mL 08/14/2019 Intramuscular   Manufacturer: [employer], [employer]   Lot: [id number]   NDC: [address]-6

## 2019-12-09 ENCOUNTER — Telehealth: Payer: Self-pay | Admitting: Family Medicine

## 2019-12-09 ENCOUNTER — Other Ambulatory Visit: Payer: Medicare Other

## 2019-12-09 NOTE — Telephone Encounter (Signed)
Pt called asking for nausea medication pt wants it sent to [facility]

## 2019-12-10 NOTE — Telephone Encounter (Signed)
She has not been seen in 2 years and does not have an appointment with the Clinic and was not ready for an appointment with [place] when Erskine Squibb calledd her. She can obtain this from the Physician she sees for follow up or her Cardiologist and when she reestablishes we will resume her care.

## 2019-12-15 NOTE — Progress Notes (Deleted)
Cardiology Office Note:    Date:  12/15/2019   ID:  Robin Patel, DOB 05/13/1951, MRN [id number]  PCP:  Robin Rakes, MD  Cardiologist:  Robin Carnes, MD *** Electrophysiologist:  None   Referring MD: Robin Rakes, MD   Chief Complaint:  No chief complaint on file.    Patient Profile:    Robin Patel is a 69 y.o. female with:   Chronic systolic CHF  Non-ischemic cardiomyopathy   Cath 11/2019: no CAD   Echocardiogram 11/2019: EF < 20, mod effusion (no tamponade)  Pericardial effusion  Prior CV studies: R/L Cardiac catheterization 12/04/2019 Normal coronary arteries mild pulmonary hypertension (mean PA 27 mmHg) LVEDP 18 CI 2.67  Echocardiogram 12/02/2019 EF < 20, goval HK, mod LVE, normla RVSF, mod pericardial effusion, mod pleural effusion (R&L), RA diastolic collapse - no evidence of tamponade, mild MR, trivial AI   History of Present Illness:    Robin Patel was admitted 3/30-4/3 with acute systolic CHF.  Cardiac catheterization demonstrated no CAD.  An echocardiogram showed an EF of < 20% and a moderate pericardial effusion (reviewed by Robin Patel and felt to be small) without tamponade physiology.  She was diuresed with IV Furosemide.  She was not able to tolerate ARB due to soft BP.   The patient has been set up to see the CHF clinic and actually has an appt tomorrow with Dr. Aundra Patel.  The recommendation from our team in the hospital was to get a repeat echocardiogram in 2-4 weeks to recheck her effusion.  She will also need a cardiac MRI to further workup her non-ischemic cardiomyopathy.  Also, she will need a repeat echocardiogram in 3 mos to determine if she will need an ICD.  The DICTATELATER SmartLink is not supported in this context. ***   Past Medical History:  Diagnosis Date  . Constipation     Current Medications: No outpatient medications have been marked as taking for the 12/16/19 encounter (Appointment) with Robin Dopp T, PA-C.     Allergies:    Patient has no known allergies.   Social History   Tobacco Use  . Smoking status: Never Smoker  . Smokeless tobacco: Never Used  Substance Use Topics  . Alcohol use: No  . Drug use: No     Family Hx: The patient's family history includes Heart attack in her father and mother; Heart failure in her father and mother; Hypertension in her father.  ROS   EKGs/Labs/Other Test Reviewed:    EKG:  EKG is *** ordered today.  The ekg ordered today demonstrates ***  Recent Labs: 12/01/2019: B Natriuretic Peptide 848.7 12/02/2019: Magnesium 2.0; TSH 0.985 12/03/2019: ALT 124 12/04/2019: BUN 16; Creatinine, Ser 0.89; Hemoglobin 14.6; Platelets 279; Potassium 3.8; Sodium 141   Recent Lipid Panel Lab Results  Component Value Date/Time   CHOL 161 12/02/2019 04:41 AM   TRIG 112 12/02/2019 04:41 AM   HDL 49 12/02/2019 04:41 AM   CHOLHDL 3.3 12/02/2019 04:41 AM   LDLCALC 90 12/02/2019 04:41 AM    Physical Exam:    VS:  There were no vitals taken for this visit.    Wt Readings from Last 3 Encounters:  12/05/19 147 lb 14.4 oz (67.1 kg)  11/17/19 155 lb (70.3 kg)  11/27/17 159 lb 3.2 oz (72.2 kg)     Physical Exam ***  ASSESSMENT & PLAN:    ***  Dispo:  No follow-ups on file.   Medication Adjustments/Labs and Tests Ordered: Current medicines are reviewed at  length with the patient today.  Concerns regarding medicines are outlined above.  Tests Ordered: No orders of the defined types were placed in this encounter.  Medication Changes: No orders of the defined types were placed in this encounter.   Signed, Robin Newcomer, PA-C  12/15/2019 9:47 PM    [facility] [address] [place], [place], [place]  [address] Phone: [phone]; Fax: [phone]

## 2019-12-16 ENCOUNTER — Ambulatory Visit: Payer: Medicare Other | Admitting: Physician Assistant

## 2019-12-17 ENCOUNTER — Encounter (HOSPITAL_COMMUNITY): Payer: Self-pay | Admitting: Cardiology

## 2019-12-17 ENCOUNTER — Other Ambulatory Visit: Payer: Self-pay

## 2019-12-17 ENCOUNTER — Ambulatory Visit (HOSPITAL_COMMUNITY)
Admission: RE | Admit: 2019-12-17 | Discharge: 2019-12-17 | Disposition: A | Discharge status: Home / Self Care | Payer: Medicare Other | Source: Ambulatory Visit | Attending: Cardiology | Admitting: Cardiology

## 2019-12-17 VITALS — BP 120/90 | HR 100 | Wt 158.4 lb

## 2019-12-17 DIAGNOSIS — Z79899 Other long term (current) drug therapy: Secondary | ICD-10-CM | POA: Insufficient documentation

## 2019-12-17 DIAGNOSIS — I5043 Acute on chronic combined systolic (congestive) and diastolic (congestive) heart failure: Secondary | ICD-10-CM | POA: Diagnosis not present

## 2019-12-17 DIAGNOSIS — R1310 Dysphagia, unspecified: Secondary | ICD-10-CM | POA: Diagnosis not present

## 2019-12-17 DIAGNOSIS — K219 Gastro-esophageal reflux disease without esophagitis: Secondary | ICD-10-CM | POA: Insufficient documentation

## 2019-12-17 DIAGNOSIS — Z8249 Family history of ischemic heart disease and other diseases of the circulatory system: Secondary | ICD-10-CM | POA: Insufficient documentation

## 2019-12-17 DIAGNOSIS — I509 Heart failure, unspecified: Secondary | ICD-10-CM | POA: Diagnosis not present

## 2019-12-17 DIAGNOSIS — I313 Pericardial effusion (noninflammatory): Secondary | ICD-10-CM | POA: Diagnosis not present

## 2019-12-17 DIAGNOSIS — I428 Other cardiomyopathies: Secondary | ICD-10-CM | POA: Insufficient documentation

## 2019-12-17 DIAGNOSIS — I5022 Chronic systolic (congestive) heart failure: Secondary | ICD-10-CM | POA: Insufficient documentation

## 2019-12-17 MED ORDER — LOSARTAN POTASSIUM 25 MG PO TABS: 12.5000 mg | ORAL_TABLET | Freq: Every day | ORAL | 3 refills | Status: DC

## 2019-12-17 MED ORDER — FUROSEMIDE 20 MG PO TABS: ORAL_TABLET | ORAL | 3 refills | Status: DC

## 2019-12-17 MED ORDER — SPIRONOLACTONE 25 MG PO TABS: 12.5000 mg | ORAL_TABLET | Freq: Every day | ORAL | 3 refills | Status: DC

## 2019-12-17 MED ORDER — PANTOPRAZOLE SODIUM 40 MG PO TBEC: 40.0000 mg | DELAYED_RELEASE_TABLET | Freq: Every day | ORAL | 3 refills | Status: DC

## 2019-12-17 NOTE — Patient Instructions (Addendum)
START Protonix 40mg  (1 tab) daily  START Spironolactone 12.5mg  (1/2 tab) daily  START Losartan 12.5mg  (1/2 tab) at night  INCREASE Lasix to 40mg  (2 tabs) in the morning and 20mg  (1 tab) in the evening  You have been referred for a Cardiac MRI. You will get a call to schedule this appointment  You have been referred to Gastroenterology. They will call you to schedule an appointment  Labs in 1 week We will only contact you if something comes back abnormal or we need to make some changes. Otherwise no news is good news!  Your physician recommends that you schedule a follow-up appointment in: 2 weeks with the Nurse Practitioner and 1 month with Dr   April Garage code: [id number]  May Garage code: [id number]  Please call office at [phone] option 2 if you have any questions or concerns.   At the [facility], you and your health needs are our priority. As part of our continuing mission to provide you with exceptional heart care, we have created designated Provider Care Teams. These Care Teams include your primary Cardiologist (physician) and Advanced Practice Providers (APPs- Physician Assistants and Nurse Practitioners) who all work together to provide you with the care you need, when you need it.   You may see any of the following providers on your designated Care Team at your next follow up: 07-06-1977 Dr [id number] . Dr [phone] . Marland Kitchen, NP . Arvilla Meres, PA . Marca Ancona, PharmD   Please be sure to bring in all your medications bottles to every appointment.

## 2019-12-17 NOTE — Progress Notes (Signed)
PCP: Charlott Rakes, MD HF Cardiology: Dr. Aundra Dubin  69 y.o. with chronic systolic CHF was referred by Dr. Stanford Breed for evaluation of CHF.  Patient had no known cardiac history prior to 3/21.  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  Several weeks prior to 3/21 admission, she began to note exertional dyspnea.  This worsened to shortness of breath with minimal exertion and severe orthopnea.  She was admitted in 3/21 with dyspnea, CXR showed pulmonary edema and echo showed EF <20% with moderate LV dilation.  Low BP limited medication titration.  RHC/LHC showed no coronary disease, preserved cardiac output.  She was discharged home.    She now has less dyspnea but still gets short of breath walking more than about 50 feet, walking up stairs, or talking for a long period of time.  She still has orthopnea and bendopnea. She gets lightheaded if she stands up too much.  She additionally has difficulty with dysphagia, she has to drink water to get food down.   ECG (personally reviewed): Sinus tachy, narrow QRS, septal Qs, LAFB.   Labs (3/21): LDL 90 Labs (4/21): K 3.9, creatinine 0.89, TSH normal  PMH:  1. GERD 2. Chronic systolic CHF: Diagnosed 7/32.  Nonischemic cardiomyopathy.  - Echo (3/21): EF < 20%, moderate LV dilation, normal RV, mild-moderate pericardial effusion.  - RHC/LHC (4/21): No CAD; mean RA 6, PA 39/19, mean PCWP 17, CI 2.67  FH: Parents with MIs in their 49s, sister with "enlarged heart."    SH: Married with 5 kids. No ETOH, drugs.  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   ROS: All systems reviewed and negative except as per HPI.   Current Outpatient Medications  Medication Sig Dispense Refill  . bismuth subsalicylate (PEPTO BISMOL) 262 MG/15ML suspension Take 30 mLs by mouth every 6 (six) hours as needed for indigestion or diarrhea or loose stools.    . dimenhyDRINATE (DRAMAMINE PO) Take by mouth daily as  needed.    . famotidine (PEPCID) 20 MG tablet Take 1 tablet (20 mg total) by mouth 2 (two) times daily. 60 tablet 0  . furosemide (LASIX) 20 MG tablet Take 2 tablets (40 mg total) by mouth every morning AND 1 tablet (20 mg total) every evening. 90 tablet 3  . guaiFENesin (MUCINEX) 600 MG 12 hr tablet Take 600 mg by mouth 2 (two) times daily as needed for cough.    . metoprolol succinate (TOPROL-XL) 25 MG 24 hr tablet Take 0.5 tablets (12.5 mg total) by mouth daily. 30 tablet 3  . Multiple Vitamins-Minerals (MULTIVITAMIN WITH MINERALS) tablet Take 1 tablet by mouth daily.    . potassium chloride (KLOR-CON) 10 MEQ tablet Take 1 tablet (10 mEq total) by mouth daily. 30 tablet 3  . Sennosides 25 MG TABS Take by mouth daily as needed.    . Simethicone 250 MG CAPS Take 1 capsule by mouth 2 (two) times daily as needed. 20 capsule 0  . losartan (COZAAR) 25 MG tablet Take 0.5 tablets (12.5 mg total) by mouth at bedtime. 15 tablet 3  . pantoprazole (PROTONIX) 40 MG tablet Take 1 tablet (40 mg total) by mouth daily. 30 tablet 3  . spironolactone (ALDACTONE) 25 MG tablet Take 0.5 tablets (12.5 mg total) by mouth daily. 15 tablet 3   No current facility-administered medications for this encounter.   BP 120/90   Pulse 100   Wt 71.8 kg (158 lb 6.4 oz)   SpO2  99%   BMI 28.06 kg/m  General: NAD Neck: JVP 9-10 cm, no thyromegaly or thyroid nodule.  Lungs: Clear to auscultation bilaterally with normal respiratory effort. CV: Nondisplaced PMI.  Heart mildly tachy, regular S1/S2, no S3/S4, no murmur.  Trace ankle edema.  No carotid bruit.  Normal pedal pulses.  Abdomen: Soft, nontender, no hepatosplenomegaly, no distention.  Skin: Intact without lesions or rashes.  Neurologic: Alert and oriented x 3.  Psych: Normal affect. Extremities: No clubbing or cyanosis.  HEENT: Normal.   Assessment/Plan: 1. Chronic systolic CHF: Nonischemic cardiomyopathy.  Echo in 3/21 with EF < 20%, moderate LV dilation.   LHC/RHC in 4/21 with no CAD, preserved cardiac output.  Etiology is uncertain, she does not use ETOH/drugs, no definite family history. Possible viral myocarditis. NYHA class III symptoms, she is still volume overloaded on exam. Medication titration has been limited by low BP and lightheadedness, though BP is 120/90 here.  - Continue Toprol XL 12.5 mg daily.  - Increase Lasix to 40 qam/20 qpm.  BMET today and in 10 days.  - Add spironolactone 12.5 mg daily.  - Add losartan 12.5 mg qhs.  If she tolerates this, may be able to transition to [place].  - She will get cardiac MRI to assess for infiltrative disease/myocarditis.  2. Pericardial effusion: Small-moderate pericardial effusion on 3/21 echo, no tamponade.  Will reassess on cardiac MRI.  3. Dysphagia: This seems quite significant.  She has GERD symptoms after meals and could have a peptic stricture.   - I will refer to GI for evaluation, will need EGD.  - Add Protonix 40 mg daily.   See NP/PA in 2 wks, see me in 1 month.   Marca Ancona 12/17/2019

## 2019-12-24 ENCOUNTER — Ambulatory Visit (HOSPITAL_COMMUNITY)
Admission: RE | Admit: 2019-12-24 | Discharge: 2019-12-24 | Disposition: A | Discharge status: Home / Self Care | Payer: Medicare Other | Source: Ambulatory Visit | Attending: Internal Medicine | Admitting: Internal Medicine

## 2019-12-24 ENCOUNTER — Other Ambulatory Visit: Payer: Self-pay

## 2019-12-24 DIAGNOSIS — I509 Heart failure, unspecified: Secondary | ICD-10-CM | POA: Insufficient documentation

## 2019-12-24 LAB — BASIC METABOLIC PANEL
Anion gap: 9 (ref 5–15)
BUN: 17 mg/dL (ref 8–23)
CO2: 24 mmol/L (ref 22–32)
Calcium: 8.6 mg/dL — ABNORMAL LOW (ref 8.9–10.3)
Chloride: 106 mmol/L (ref 98–111)
Creatinine, Ser: 0.94 mg/dL (ref 0.44–1.00)
GFR calc Af Amer: >60 mL/min (ref >60)
GFR calc non Af Amer: >60 mL/min (ref >60)
Glucose, Bld: 118 mg/dL — ABNORMAL HIGH (ref 70–99)
Potassium: 3.4 mmol/L — ABNORMAL LOW (ref 3.5–5.1)
Sodium: 139 mmol/L (ref 135–145)

## 2019-12-24 LAB — BRAIN NATRIURETIC PEPTIDE: B Natriuretic Peptide: 354.5 pg/mL — ABNORMAL HIGH (ref 0.0–100.0)

## 2019-12-24 NOTE — Addendum Note (Signed)
Encounter addended by: Modesta Messing, CMA on: 12/24/2019 4:19 PM  Actions taken: Care Teams modified

## 2019-12-29 ENCOUNTER — Other Ambulatory Visit (HOSPITAL_COMMUNITY): Payer: Self-pay | Admitting: *Deleted

## 2019-12-29 MED ORDER — FAMOTIDINE 20 MG PO TABS: 20.0000 mg | ORAL_TABLET | Freq: Two times a day (BID) | ORAL | 0 refills | Status: DC

## 2019-12-31 ENCOUNTER — Encounter: Payer: Self-pay | Admitting: Cardiology

## 2019-12-31 ENCOUNTER — Encounter (HOSPITAL_COMMUNITY): Payer: Medicare Other

## 2020-01-05 ENCOUNTER — Ambulatory Visit: Payer: Medicare Other | Admitting: Physician Assistant

## 2020-01-07 ENCOUNTER — Other Ambulatory Visit (HOSPITAL_COMMUNITY): Payer: Self-pay | Admitting: *Deleted

## 2020-01-07 MED ORDER — FUROSEMIDE 20 MG PO TABS: ORAL_TABLET | ORAL | 3 refills | Status: DC

## 2020-01-07 MED ORDER — POTASSIUM CHLORIDE CRYS ER 10 MEQ PO TBCR: 10.0000 meq | EXTENDED_RELEASE_TABLET | Freq: Every day | ORAL | 3 refills | Status: DC

## 2020-01-13 ENCOUNTER — Other Ambulatory Visit (HOSPITAL_COMMUNITY): Payer: Self-pay | Admitting: *Deleted

## 2020-01-13 MED ORDER — SPIRONOLACTONE 25 MG PO TABS: 12.5000 mg | ORAL_TABLET | Freq: Every day | ORAL | 3 refills | Status: DC

## 2020-01-15 ENCOUNTER — Other Ambulatory Visit (HOSPITAL_COMMUNITY): Payer: Self-pay | Admitting: *Deleted

## 2020-01-15 MED ORDER — PANTOPRAZOLE SODIUM 40 MG PO TBEC: 40.0000 mg | DELAYED_RELEASE_TABLET | Freq: Every day | ORAL | 3 refills | Status: DC

## 2020-01-15 MED ORDER — SPIRONOLACTONE 25 MG PO TABS: 12.5000 mg | ORAL_TABLET | Freq: Every day | ORAL | 3 refills | Status: DC

## 2020-01-18 ENCOUNTER — Telehealth (HOSPITAL_COMMUNITY): Payer: Self-pay | Admitting: *Deleted

## 2020-01-18 NOTE — Telephone Encounter (Signed)
Pt left VM requesting the date and time of her MRI.  I called pt back to inform her and she told me she was aware of the time/date of MRI pt thanked me for the return call.

## 2020-01-20 ENCOUNTER — Ambulatory Visit (HOSPITAL_COMMUNITY)
Admission: RE | Admit: 2020-01-20 | Discharge: 2020-01-20 | Disposition: A | Discharge status: Home / Self Care | Payer: Medicare Other | Source: Ambulatory Visit | Attending: Cardiology | Admitting: Cardiology

## 2020-01-20 ENCOUNTER — Other Ambulatory Visit: Payer: Self-pay

## 2020-01-20 ENCOUNTER — Encounter (HOSPITAL_COMMUNITY): Payer: Self-pay | Admitting: Cardiology

## 2020-01-20 VITALS — BP 116/70 | HR 103 | Wt 153.0 lb

## 2020-01-20 DIAGNOSIS — I428 Other cardiomyopathies: Secondary | ICD-10-CM | POA: Insufficient documentation

## 2020-01-20 DIAGNOSIS — K219 Gastro-esophageal reflux disease without esophagitis: Secondary | ICD-10-CM

## 2020-01-20 DIAGNOSIS — I5043 Acute on chronic combined systolic (congestive) and diastolic (congestive) heart failure: Secondary | ICD-10-CM

## 2020-01-20 DIAGNOSIS — I313 Pericardial effusion (noninflammatory): Secondary | ICD-10-CM | POA: Diagnosis not present

## 2020-01-20 DIAGNOSIS — I5022 Chronic systolic (congestive) heart failure: Secondary | ICD-10-CM

## 2020-01-20 DIAGNOSIS — Z79899 Other long term (current) drug therapy: Secondary | ICD-10-CM | POA: Insufficient documentation

## 2020-01-20 LAB — BASIC METABOLIC PANEL
Anion gap: 7 (ref 5–15)
BUN: 15 mg/dL (ref 8–23)
CO2: 25 mmol/L (ref 22–32)
Calcium: 8.9 mg/dL (ref 8.9–10.3)
Chloride: 106 mmol/L (ref 98–111)
Creatinine, Ser: 0.77 mg/dL (ref 0.44–1.00)
GFR calc Af Amer: >60 mL/min (ref >60)
GFR calc non Af Amer: >60 mL/min (ref >60)
Glucose, Bld: 92 mg/dL (ref 70–99)
Potassium: 3.5 mmol/L (ref 3.5–5.1)
Sodium: 138 mmol/L (ref 135–145)

## 2020-01-20 MED ORDER — SACUBITRIL-VALSARTAN 24-26 MG PO TABS: 1.0000 | ORAL_TABLET | Freq: Two times a day (BID) | ORAL | 5 refills | Status: DC

## 2020-01-20 MED ORDER — IVABRADINE HCL 5 MG PO TABS: 5.0000 mg | ORAL_TABLET | Freq: Two times a day (BID) | ORAL | 4 refills | Status: DC

## 2020-01-20 NOTE — Patient Instructions (Signed)
STOP Losartan  START Entresto 24/26mg  (1 tab) twice a day  START Corlanor 5mg  (1 tab) twice a day  Labs today We will only contact you if something comes back abnormal or we need to make some changes. Otherwise no news is good news!  Repeat labs in 10 days  Your physician recommends that you schedule a follow-up appointment in: 3 weeks with the pharmacist and 2 months with Dr  Please call office at [phone] option 2 if you have any questions or concerns.   At the [facility], you and your health needs are our priority. As part of our continuing mission to provide you with exceptional heart care, we have created designated Provider Care Teams. These Care Teams include your primary Cardiologist (physician) and Advanced Practice Providers (APPs- Physician Assistants and Nurse Practitioners) who all work together to provide you with the care you need, when you need it.   You may see any of the following providers on your designated Care Team at your next follow up: [phone] Dr Marland Kitchen . Dr Arvilla Meres . Marca Ancona, NP . Tonye Becket, PA . Robbie Lis, PharmD   Please be sure to bring in all your medications bottles to every appointment.

## 2020-01-21 ENCOUNTER — Other Ambulatory Visit (HOSPITAL_COMMUNITY): Payer: Self-pay | Admitting: Cardiology

## 2020-01-22 ENCOUNTER — Telehealth (HOSPITAL_COMMUNITY): Payer: Self-pay | Admitting: Pharmacy Technician

## 2020-01-22 NOTE — Progress Notes (Signed)
PCP: Charlott Rakes, MD HF Cardiology: Dr. Aundra Dubin  69 y.o. with chronic systolic CHF was referred by Dr. Stanford Breed for evaluation of CHF.  Patient had no known cardiac history prior to 3/21.  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  Several weeks prior to 3/21 admission, she began to note exertional dyspnea.  This worsened to shortness of breath with minimal exertion and severe orthopnea.  She was admitted in 3/21 with dyspnea, CXR showed pulmonary edema and echo showed EF <20% with moderate LV dilation.  Low BP limited medication titration.  RHC/LHC showed no coronary disease, preserved cardiac output.  She was discharged home.    She returns for followup of CHF.  At last appointment, Lasix was increased.  She feels significantly better since that time.  However, still short of breath if she walks fast or walks up stairs.  No dyspnea walking at a slow pace on flat ground. No lightheadedness.  No chest pain.  No orthopnea/PND.  Since last appointment, dysphagia resolved on its own without intervention, she canceled her GI appt.   Labs (3/21): LDL 90 Labs (4/21): K 3.9, creatinine 0.89 => 0.94, TSH normal, BNP 355  PMH:  1. GERD 2. Chronic systolic CHF: Diagnosed 7/12.  Nonischemic cardiomyopathy.  - Echo (3/21): EF < 20%, moderate LV dilation, normal RV, mild-moderate pericardial effusion.  - RHC/LHC (4/21): No CAD; mean RA 6, PA 39/19, mean PCWP 17, CI 2.67  FH: Parents with MIs in their 30s, sister with "enlarged heart."    SH: Married with 5 kids. No ETOH, drugs.  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   ROS: All systems reviewed and negative except as per HPI.   Current Outpatient Medications  Medication Sig Dispense Refill  . bismuth subsalicylate (PEPTO BISMOL) 262 MG/15ML suspension Take 30 mLs by mouth every 6 (six) hours as needed for indigestion or diarrhea or loose stools.    . dimenhyDRINATE (DRAMAMINE PO) Take  by mouth daily as needed.    . furosemide (LASIX) 20 MG tablet Take 2 tablets (40 mg total) by mouth every morning AND 1 tablet (20 mg total) every evening. 90 tablet 3  . metoprolol succinate (TOPROL-XL) 25 MG 24 hr tablet Take 0.5 tablets (12.5 mg total) by mouth daily. 30 tablet 3  . Multiple Vitamins-Minerals (MULTIVITAMIN WITH MINERALS) tablet Take 1 tablet by mouth daily.    . pantoprazole (PROTONIX) 40 MG tablet Take 1 tablet (40 mg total) by mouth daily. 30 tablet 3  . potassium chloride (KLOR-CON) 10 MEQ tablet Take 1 tablet (10 mEq total) by mouth daily. 30 tablet 3  . Sennosides 25 MG TABS Take by mouth daily as needed.    . Simethicone 250 MG CAPS Take 1 capsule by mouth 2 (two) times daily as needed. 20 capsule 0  . spironolactone (ALDACTONE) 25 MG tablet Take 0.5 tablets (12.5 mg total) by mouth daily. 15 tablet 3  . famotidine (PEPCID) 20 MG tablet TAKE 1 TABLET BY MOUTH TWICE A DAY 180 tablet 3  . ivabradine (CORLANOR) 5 MG TABS tablet Take 1 tablet (5 mg total) by mouth 2 (two) times daily with a meal. 60 tablet 4  . sacubitril-valsartan (ENTRESTO) 24-26 MG Take 1 tablet by mouth 2 (two) times daily. 60 tablet 5   No current facility-administered medications for this encounter.   BP 116/70   Pulse (!) 103   Wt 69.4 kg (153 lb)   SpO2 97%  BMI 27.10 kg/m  General: NAD Neck: JVP 7-8 cm, no thyromegaly or thyroid nodule.  Lungs: Clear to auscultation bilaterally with normal respiratory effort. CV: Nondisplaced PMI.  Heart mildly tachy regular S1/S2, no S3/S4, no murmur.  No peripheral edema.  No carotid bruit.  Normal pedal pulses.  Abdomen: Soft, nontender, no hepatosplenomegaly, no distention.  Skin: Intact without lesions or rashes.  Neurologic: Alert and oriented x 3.  Psych: Normal affect. Extremities: No clubbing or cyanosis.  HEENT: Normal.   Assessment/Plan: 1. Chronic systolic CHF: Nonischemic cardiomyopathy.  Echo in 3/21 with EF < 20%, moderate LV dilation.   LHC/RHC in 4/21 with no CAD, preserved cardiac output.  Etiology is uncertain, she does not use ETOH/drugs, no definite family history. Possible viral myocarditis. NYHA class III symptoms; at most minimally volume overloaded on exam,  improved compared to last appointment. Medication titration has been limited by low BP and lightheadedness though this seems improved.  - Continue Toprol XL 12.5 mg daily.  - Continue Lasix 40 qam/20 qpm.   - Continue spironolactone 12.5 mg daily.  - Stop losartan, start Entresto 24/26 bid.  BMET today and again in 10 days.   - With sinus tachydcardia, I will start her on Corlanor 5 mg bid.  - She will get cardiac MRI to assess for infiltrative disease/myocarditis (scheduled 5/27).  2. Pericardial effusion: Small-moderate pericardial effusion on 3/21 echo, no tamponade.  Will reassess on cardiac MRI.   Followup with CHF pharmacist in 3 wks, then seem me in 2 months  Robin Patel 01/22/2020

## 2020-01-22 NOTE — Telephone Encounter (Addendum)
Advanced Heart Failure Patient Advocate Encounter  Prior Authorization for Corlanor has been approved.    PA# [id number] Effective dates: 12/23/19 through 01/21/21  Patients co-pay is $100  Started [employer] patient assistance application. Left patient voicemail. Will follow up.

## 2020-01-27 ENCOUNTER — Telehealth (HOSPITAL_COMMUNITY): Payer: Self-pay | Admitting: *Deleted

## 2020-01-27 NOTE — Telephone Encounter (Signed)
[employer] cancelled. [facility] is out of network with pts cigna plan. Pt has no out of network benefits.

## 2020-01-27 NOTE — Telephone Encounter (Signed)
Spoke with patient. Will take [employer] application to check in desk for her to sign. She will bring insurance cards with her to send as well. May need proof of income in the future.   Patient is aware.  Will follow up.

## 2020-01-28 ENCOUNTER — Ambulatory Visit (HOSPITAL_COMMUNITY): Payer: Medicare Other

## 2020-02-02 ENCOUNTER — Ambulatory Visit (HOSPITAL_COMMUNITY)
Admission: RE | Admit: 2020-02-02 | Discharge: 2020-02-02 | Disposition: A | Discharge status: Home / Self Care | Payer: Medicare Other | Source: Ambulatory Visit | Attending: Cardiology | Admitting: Cardiology

## 2020-02-02 ENCOUNTER — Other Ambulatory Visit: Payer: Self-pay

## 2020-02-02 DIAGNOSIS — I5022 Chronic systolic (congestive) heart failure: Secondary | ICD-10-CM | POA: Diagnosis not present

## 2020-02-02 LAB — BASIC METABOLIC PANEL
Anion gap: 9 (ref 5–15)
BUN: 16 mg/dL (ref 8–23)
CO2: 26 mmol/L (ref 22–32)
Calcium: 8.8 mg/dL — ABNORMAL LOW (ref 8.9–10.3)
Chloride: 105 mmol/L (ref 98–111)
Creatinine, Ser: 0.86 mg/dL (ref 0.44–1.00)
GFR calc Af Amer: >60 mL/min (ref >60)
GFR calc non Af Amer: >60 mL/min (ref >60)
Glucose, Bld: 109 mg/dL — ABNORMAL HIGH (ref 70–99)
Potassium: 3.7 mmol/L (ref 3.5–5.1)
Sodium: 140 mmol/L (ref 135–145)

## 2020-02-09 NOTE — Telephone Encounter (Signed)
Was successful in securing patient an $1000.00 grant from Patient [facility]) to provide copayment coverage for Corlanor.  This will keep the out of pocket expense at $0.     I have spoken with the patient.    The billing information is as follows and has [facility]   Member ID: [id number] Group ID: [id number] RxBin ID: [id number] PCN: [id number] Eligibility Start Date: 11/11/2019 Eligibility End Date: 02/07/2021 Assistance Amount: $1,000.00  Fund:  Heart Failure  We will go to [employer] in the future if PAN is closed when her funds are low.  Archer Asa, CPhT

## 2020-02-11 ENCOUNTER — Telehealth (HOSPITAL_COMMUNITY): Payer: Self-pay | Admitting: Pharmacist

## 2020-02-11 MED ORDER — METOPROLOL SUCCINATE ER 25 MG PO TB24: 12.5000 mg | ORAL_TABLET | Freq: Every day | ORAL | 11 refills | Status: DC

## 2020-02-11 NOTE — Telephone Encounter (Signed)
Refill for metoprolol succinate sent to [facility] per patient request.

## 2020-02-20 ENCOUNTER — Other Ambulatory Visit: Payer: Self-pay

## 2020-02-20 ENCOUNTER — Emergency Department (HOSPITAL_COMMUNITY)
Admission: EM | Admit: 2020-02-20 | Discharge: 2020-02-20 | Disposition: A | Discharge status: Home / Self Care | Payer: Medicare Other | Attending: Emergency Medicine | Admitting: Emergency Medicine

## 2020-02-20 ENCOUNTER — Emergency Department (HOSPITAL_COMMUNITY): Payer: Medicare Other

## 2020-02-20 ENCOUNTER — Encounter (HOSPITAL_COMMUNITY): Payer: Self-pay | Admitting: Emergency Medicine

## 2020-02-20 DIAGNOSIS — I509 Heart failure, unspecified: Secondary | ICD-10-CM | POA: Diagnosis not present

## 2020-02-20 DIAGNOSIS — Y999 Unspecified external cause status: Secondary | ICD-10-CM | POA: Diagnosis not present

## 2020-02-20 DIAGNOSIS — Y93F9 Activity, other caregiving: Secondary | ICD-10-CM | POA: Diagnosis not present

## 2020-02-20 DIAGNOSIS — S022XXA Fracture of nasal bones, initial encounter for closed fracture: Secondary | ICD-10-CM | POA: Diagnosis not present

## 2020-02-20 DIAGNOSIS — S0993XA Unspecified injury of face, initial encounter: Secondary | ICD-10-CM | POA: Diagnosis present

## 2020-02-20 DIAGNOSIS — Y92019 Unspecified place in single-family (private) house as the place of occurrence of the external cause: Secondary | ICD-10-CM | POA: Diagnosis not present

## 2020-02-20 HISTORY — DX: Heart failure, unspecified: I50.9

## 2020-02-20 HISTORY — DX: Gastro-esophageal reflux disease without esophagitis: K21.9

## 2020-02-20 NOTE — ED Notes (Signed)
Patient verbalizes understanding of discharge instructions. Opportunity for questioning and answers were provided. Armband removed by staff, pt discharged from ED.  

## 2020-02-20 NOTE — ED Notes (Signed)
Patient returned from CT
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
## 2020-02-22 ENCOUNTER — Ambulatory Visit (HOSPITAL_COMMUNITY)
Admission: RE | Admit: 2020-02-22 | Discharge: 2020-02-22 | Disposition: A | Discharge status: Home / Self Care | Payer: Medicare Other | Source: Ambulatory Visit | Attending: Internal Medicine | Admitting: Internal Medicine

## 2020-02-22 ENCOUNTER — Other Ambulatory Visit: Payer: Self-pay

## 2020-02-22 VITALS — BP 140/74 | HR 76 | Wt 155.2 lb

## 2020-02-22 DIAGNOSIS — I428 Other cardiomyopathies: Secondary | ICD-10-CM | POA: Insufficient documentation

## 2020-02-22 DIAGNOSIS — I5022 Chronic systolic (congestive) heart failure: Secondary | ICD-10-CM | POA: Diagnosis present

## 2020-02-22 DIAGNOSIS — J90 Pleural effusion, not elsewhere classified: Secondary | ICD-10-CM | POA: Insufficient documentation

## 2020-02-22 DIAGNOSIS — Z79899 Other long term (current) drug therapy: Secondary | ICD-10-CM | POA: Diagnosis not present

## 2020-02-22 MED ORDER — DAPAGLIFLOZIN PROPANEDIOL 10 MG PO TABS: 10.0000 mg | ORAL_TABLET | Freq: Every day | ORAL | 11 refills | Status: DC

## 2020-02-22 NOTE — Patient Instructions (Signed)
It was a pleasure seeing you today!  MEDICATIONS: -We are changing your medications today -Start Farxiga 10 mg (1 tablet) daily -Call if you have questions about your medications.  NEXT APPOINTMENT: Return to clinic in 5 weeks with Dr. Shirlee Latch  In general, to take care of your heart failure: -Limit your fluid intake to 2 Liters (half-gallon) per day.   -Limit your salt intake to ideally 2-3 grams (2000-3000 mg) per day. -Weigh yourself daily and record, and bring that "weight diary" to your next appointment.  (Weight gain of 2-3 pounds in 1 day typically means fluid weight.) -The medications for your heart are to help your heart and help you live longer.   -Please contact us before stopping any of your heart medications.  Call the clinic at [phone] with questions or to reschedule future appointments.

## 2020-02-22 NOTE — Progress Notes (Signed)
PCP: Hoy Register, MD HF Cardiology: Dr. Shirlee Latch  HPI: 69 y.o. with chronic systolic CHF who was referred by Dr. [name] to Dr. Shirlee Latch for evaluation of CHF. Patient had no known cardiac history prior to 11/22/19. 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 Several weeks prior to 11/22/19 admission, she began to note exertional dyspnea. This problem worsened to shortness of breath with minimal exertion and severe orthopnea.  She was admitted in 11/22/19 with dyspnea, CXR showed pulmonary edema and echo showed EF <20% with moderate LV dilation. During hospitalization, low BP limited medication titration. RHC/LHC showed no coronary disease and preserved cardiac output.   Recently presented to HF Clinic on 01/20/20 with Dr. Shirlee Latch. She reported feeling significantly better since her hospitalization. However, she still felt short of breath if she walked fast or walked up stairs. She was able to walk at a slow pace on flat ground without dyspnea. She denied lightheadedness, chest pain, orthopnea/PND. Of note, she explained that her dysphagia resolved on its own without intervention.   Today she returns to HF clinic for pharmacist medication titration. At last visit with MD, losartan was discontinued and Entresto 24/26 mg BID was initiated. She reports good adherence to all her medications but did run out of Entresto today and missed this morning's dose. She reports no adverse effects to her medications and only feels dizziness and lightheadedness in the heat. Overall, she feels okay without any major changes in the last month. She denies chest pain or palpitations. Her activity remains limited, but she tries to go walking. However, her walking has decreased lately due to recent social issues at home.  She does her best to adhere to a low-sodium diet and avoids fried food. Patient checks weight at home daily, which is typically 150-153 lbs. She denies swelling, orthopnea/PND,  or increase in shortness of breath. She is able to perform all ADLs independently.  Marland Kitchen Shortness of breath/dyspnea on exertion? no  . Orthopnea/PND? no . Edema? no . Lightheadedness/dizziness? Yes - only in the heat . Daily weights at home? yes . Blood pressure/heart rate monitoring at home? no . Following low-sodium/fluid-restricted diet? yes  HF Medications: - Furosemide 40mg  AM / 20mg  PM - Metoprolol succinate 12.5 mg daily - Entresto 24/26 mg BID - Spironolactone 12.5 mg daily - Ivabradine 5 mg BID  Has the patient been experiencing any side effects to the medications prescribed?  no  Does the patient have any problems obtaining medications due to transportation or finances? No - patient has and is approved for the PAN grant for HF medications (Entresto, Corlanor)  Understanding of regimen: good Understanding of indications: good Potential of compliance: good Patient understands to avoid NSAIDs. Patient understands to avoid decongestants.    Pertinent Lab Values (02/02/20): Mudlogger Serum creatinine 0.86, BUN 16, Potassium 3.7, Sodium 140  Vital Signs: . Weight: 155.2 lbs (last clinic weight: 153 lbs) . Blood pressure: 140/74 mmHg (missed AM dose of Entresto) . Heart rate: 76 BPM   Assessment: 1. Chronic systolic CHF: Nonischemic cardiomyopathy.  Echo in 3/21 with EF < 20%, moderate LV dilation.  LHC/RHC in 4/21 with no CAD, preserved cardiac output.  Etiology is uncertain, she does not use ETOH/drugs, no definite family history. Possible viral myocarditis.  -NYHA class III symptoms; euvolemic on exam - Continue furosemide 40 qam/20 qpm. - Continue metoprolol succinate 12.5 mg daily.  - Continue Entresto 24/26 mg BID. Repeat BMET was stable. - Continue spironolactone 12.5 mg  daily. Can consider increasing to 25 mg daily at next visit. - Start Farxiga 10 mg daily. Repeat BMET at next MD visit   - Continue ivabradine 5 mg BID.  - Cardiac MRI to assess for  infiltrative disease/myocarditis per MD   2. Pericardial effusion: Small-moderate pericardial effusion on 3/21 echo, no tamponade. MD to reassess on cardiac MRI.    Plan: 1) Medication changes: Based on clinical presentation, vital signs and recent labs will start Farxiga 10 mg daily 2) Labs: Repeat BMET with next MD visit 3) Follow-up: HF clinic visit with Dr. Aundra Dubin in [place] weeks  Sherren Kerns, PharmD PGY1 [place] Resident  Vertis Kelch, PharmD, [facility] Pharmacist [phone]  Audry Riles, PharmD, BCPS, [facility], CPP Heart Failure Clinic Pharmacist [phone]

## 2020-02-29 ENCOUNTER — Other Ambulatory Visit (HOSPITAL_COMMUNITY): Payer: Self-pay | Admitting: *Deleted

## 2020-02-29 ENCOUNTER — Other Ambulatory Visit (HOSPITAL_COMMUNITY): Payer: Self-pay | Admitting: Cardiology

## 2020-02-29 MED ORDER — IVABRADINE HCL 5 MG PO TABS: 5.0000 mg | ORAL_TABLET | Freq: Two times a day (BID) | ORAL | 11 refills | Status: DC

## 2020-03-02 ENCOUNTER — Other Ambulatory Visit (HOSPITAL_COMMUNITY): Payer: Self-pay | Admitting: *Deleted

## 2020-03-02 MED ORDER — DAPAGLIFLOZIN PROPANEDIOL 10 MG PO TABS: 10.0000 mg | ORAL_TABLET | Freq: Every day | ORAL | 11 refills | Status: DC

## 2020-03-05 ENCOUNTER — Other Ambulatory Visit: Payer: Self-pay | Admitting: Cardiology

## 2020-03-05 MED ORDER — SPIRONOLACTONE 25 MG PO TABS: 12.5000 mg | ORAL_TABLET | Freq: Every day | ORAL | 3 refills | Status: DC

## 2020-03-30 ENCOUNTER — Other Ambulatory Visit (HOSPITAL_COMMUNITY): Payer: Self-pay | Admitting: *Deleted

## 2020-03-30 MED ORDER — POTASSIUM CHLORIDE CRYS ER 10 MEQ PO TBCR: 10.0000 meq | EXTENDED_RELEASE_TABLET | Freq: Every day | ORAL | 3 refills | Status: DC

## 2020-04-01 ENCOUNTER — Ambulatory Visit (HOSPITAL_COMMUNITY)
Admission: RE | Admit: 2020-04-01 | Discharge: 2020-04-01 | Disposition: A | Discharge status: Home / Self Care | Payer: Medicare Other | Source: Ambulatory Visit | Attending: Cardiology | Admitting: Cardiology

## 2020-04-01 ENCOUNTER — Other Ambulatory Visit: Payer: Self-pay

## 2020-04-01 ENCOUNTER — Encounter (HOSPITAL_COMMUNITY): Payer: Self-pay | Admitting: Cardiology

## 2020-04-01 VITALS — BP 110/80 | HR 82 | Wt 153.2 lb

## 2020-04-01 DIAGNOSIS — R0602 Shortness of breath: Secondary | ICD-10-CM | POA: Insufficient documentation

## 2020-04-01 DIAGNOSIS — Z7984 Long term (current) use of oral hypoglycemic drugs: Secondary | ICD-10-CM | POA: Diagnosis not present

## 2020-04-01 DIAGNOSIS — K219 Gastro-esophageal reflux disease without esophagitis: Secondary | ICD-10-CM | POA: Diagnosis not present

## 2020-04-01 DIAGNOSIS — I428 Other cardiomyopathies: Secondary | ICD-10-CM | POA: Diagnosis not present

## 2020-04-01 DIAGNOSIS — J90 Pleural effusion, not elsewhere classified: Secondary | ICD-10-CM | POA: Insufficient documentation

## 2020-04-01 DIAGNOSIS — Z7901 Long term (current) use of anticoagulants: Secondary | ICD-10-CM | POA: Insufficient documentation

## 2020-04-01 DIAGNOSIS — Z79899 Other long term (current) drug therapy: Secondary | ICD-10-CM | POA: Diagnosis not present

## 2020-04-01 DIAGNOSIS — I5022 Chronic systolic (congestive) heart failure: Secondary | ICD-10-CM | POA: Diagnosis present

## 2020-04-01 DIAGNOSIS — I5043 Acute on chronic combined systolic (congestive) and diastolic (congestive) heart failure: Secondary | ICD-10-CM

## 2020-04-01 LAB — BASIC METABOLIC PANEL
Anion gap: 8 (ref 5–15)
BUN: 13 mg/dL (ref 8–23)
CO2: 25 mmol/L (ref 22–32)
Calcium: 8.8 mg/dL — ABNORMAL LOW (ref 8.9–10.3)
Chloride: 106 mmol/L (ref 98–111)
Creatinine, Ser: 0.86 mg/dL (ref 0.44–1.00)
GFR calc Af Amer: >60 mL/min (ref >60)
GFR calc non Af Amer: >60 mL/min (ref >60)
Glucose, Bld: 78 mg/dL (ref 70–99)
Potassium: 3.9 mmol/L (ref 3.5–5.1)
Sodium: 139 mmol/L (ref 135–145)

## 2020-04-01 MED ORDER — SPIRONOLACTONE 25 MG PO TABS: 25.0000 mg | ORAL_TABLET | Freq: Every day | ORAL | 3 refills | Status: DC

## 2020-04-01 MED ORDER — METOPROLOL SUCCINATE ER 25 MG PO TB24: 25.0000 mg | ORAL_TABLET | Freq: Two times a day (BID) | ORAL | 3 refills | Status: DC

## 2020-04-01 NOTE — Patient Instructions (Signed)
Labs done today. We will contact you only if your labs are abnormal.  INCREASE Metoprolol XL 25mg (1 tablet) by mouth 2 times daily.  INCREASE Spironolactone 25mg (1 tablet) by mouth daily.  No other medication changes were made. Please continue all other medications as prescribed.  Your physician recommends that you schedule a follow-up appointment in: 10 days for a lab only appointment, 3 weeks for a pharmacy visit and in 2 months for an appointment with Dr. .  Your physician has requested that you have a cardiac MRI. Cardiac MRI uses a computer to create images of your heart as its beating, producing both still and moving pictures of your heart and major blood vessels. For further information please visit . Please follow the instruction sheet given to you today for more information. This has to be approved through your insurance prior to scheduling. Once approved someone from our office will contact you to schedule an appointment.  If you have any questions or concerns before your next appointment please send Shirlee Latch a message through [place] or call our office at [phone].    TO LEAVE A MESSAGE FOR THE NURSE SELECT OPTION 2, PLEASE LEAVE A MESSAGE INCLUDING: . YOUR NAME . DATE OF BIRTH . CALL BACK NUMBER . REASON FOR CALL**this is important as we prioritize the call backs  YOU WILL RECEIVE A CALL BACK THE SAME DAY AS LONG AS YOU CALL BEFORE 4:00 PM   Do the following things EVERYDAY: 1) Weigh yourself in the morning before breakfast. Write it down and keep it in a log. 2) Take your medicines as prescribed 3) Eat low salt foods--Limit salt (sodium) to 2000 mg per day.  4) Stay as active as you can everyday 5) Limit all fluids for the day to less than 2 liters    At the [facility], you and your health needs are our priority. As part of our continuing mission to provide you with exceptional heart care, we have created designated Provider Care  Teams. These Care Teams include your primary Cardiologist (physician) and Advanced Practice Providers (APPs- Physician Assistants and Nurse Practitioners) who all work together to provide you with the care you need, when you need it.   You may see any of the following providers on your designated Care Team at your next follow up: [address] [phone] . Dr Marland Kitchen . Arvilla Meres, NP . Marca Ancona, PA . Tonye Becket, PharmD   Please be sure to bring in all your medications bottles to every appointment.

## 2020-04-03 ENCOUNTER — Emergency Department (HOSPITAL_COMMUNITY)
Admission: EM | Admit: 2020-04-03 | Discharge: 2020-04-04 | Disposition: A | Discharge status: Home / Self Care | Payer: Medicare Other | Attending: Emergency Medicine | Admitting: Emergency Medicine

## 2020-04-03 DIAGNOSIS — Y999 Unspecified external cause status: Secondary | ICD-10-CM | POA: Diagnosis not present

## 2020-04-03 DIAGNOSIS — Y939 Activity, unspecified: Secondary | ICD-10-CM | POA: Diagnosis not present

## 2020-04-03 DIAGNOSIS — Y929 Unspecified place or not applicable: Secondary | ICD-10-CM | POA: Insufficient documentation

## 2020-04-03 DIAGNOSIS — Z5321 Procedure and treatment not carried out due to patient leaving prior to being seen by health care provider: Secondary | ICD-10-CM | POA: Insufficient documentation

## 2020-04-03 DIAGNOSIS — X58XXXA Exposure to other specified factors, initial encounter: Secondary | ICD-10-CM | POA: Insufficient documentation

## 2020-04-03 DIAGNOSIS — S81819A Laceration without foreign body, unspecified lower leg, initial encounter: Secondary | ICD-10-CM | POA: Diagnosis present

## 2020-04-03 NOTE — Progress Notes (Signed)
PCP: Hoy Register, MD HF Cardiology: Dr. Shirlee Latch  69 y.o. with chronic systolic CHF was referred by Dr. [name] for evaluation of CHF.  Patient had no known cardiac history prior to 3/21.  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  Several weeks prior to 3/21 admission, she began to note exertional dyspnea.  This worsened to shortness of breath with minimal exertion and severe orthopnea.  She was admitted in 3/21 with dyspnea, CXR showed pulmonary edema and echo showed EF <20% with moderate LV dilation.  Low BP limited medication titration.  RHC/LHC showed no coronary disease, preserved cardiac output.  She was discharged home.    She returns for followup of CHF.  Weight is stable. Doing well symptomatically, short of breath only with fast walking.  No orthopnea/PND.  No lightheadedness or dizziness. BP stable today. She is walking on a track for exercise.   Labs (3/21): LDL 90 Labs (4/21): K 3.9, creatinine 0.89 => 0.94, TSH normal, BNP 355 Labs (6/21): K 3.7, creatinine 0.86  PMH:  1. GERD 2. Chronic systolic CHF: Diagnosed 3/21.  Nonischemic cardiomyopathy.  - Echo (3/21): EF < 20%, moderate LV dilation, normal RV, mild-moderate pericardial effusion.  - RHC/LHC (4/21): No CAD; mean RA 6, PA 39/19, mean PCWP 17, CI 2.67  FH: Parents with MIs in their 78s, sister with "enlarged heart."    SH: Married with 5 kids. No ETOH, drugs.  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   ROS: All systems reviewed and negative except as per HPI.   Current Outpatient Medications  Medication Sig Dispense Refill  . dapagliflozin propanediol (FARXIGA) 10 MG TABS tablet Take 1 tablet (10 mg total) by mouth daily before breakfast. 30 tablet 11  . dimenhyDRINATE (DRAMAMINE PO) Take by mouth daily as needed.    . famotidine (PEPCID) 20 MG tablet TAKE 1 TABLET BY MOUTH TWICE A DAY 180 tablet 3  . furosemide (LASIX) 20 MG tablet Take 2 tablets (40  mg total) by mouth every morning AND 1 tablet (20 mg total) every evening. 90 tablet 3  . ivabradine (CORLANOR) 5 MG TABS tablet Take 1 tablet (5 mg total) by mouth 2 (two) times daily with a meal. 60 tablet 11  . metoprolol succinate (TOPROL-XL) 25 MG 24 hr tablet Take 1 tablet (25 mg total) by mouth 2 (two) times daily. 180 tablet 3  . Multiple Vitamins-Minerals (MULTIVITAMIN WITH MINERALS) tablet Take 1 tablet by mouth daily.    . pantoprazole (PROTONIX) 40 MG tablet TAKE 1 TABLET BY MOUTH EVERY DAY 90 tablet 1  . potassium chloride (KLOR-CON) 10 MEQ tablet Take 1 tablet (10 mEq total) by mouth daily. 30 tablet 3  . sacubitril-valsartan (ENTRESTO) 24-26 MG Take 1 tablet by mouth 2 (two) times daily. 60 tablet 5  . spironolactone (ALDACTONE) 25 MG tablet Take 1 tablet (25 mg total) by mouth daily. 90 tablet 3   No current facility-administered medications for this encounter.   BP 110/80   Pulse 82   Wt 69.5 kg (153 lb 3.2 oz)   SpO2 97%   BMI 27.14 kg/m  General: NAD Neck: No JVD, no thyromegaly or thyroid nodule.  Lungs: Clear to auscultation bilaterally with normal respiratory effort. CV: Nondisplaced PMI.  Heart regular S1/S2, no S3/S4, no murmur.  No peripheral edema.  No carotid bruit.  Normal pedal pulses.  Abdomen: Soft, nontender, no hepatosplenomegaly, no distention.  Skin: Intact without lesions or rashes.  Neurologic: Alert and oriented x 3.  Psych: Normal affect. Extremities: No clubbing or cyanosis.  HEENT: Normal.   Assessment/Plan: 1. Chronic systolic CHF: Nonischemic cardiomyopathy.  Echo in 3/21 with EF < 20%, moderate LV dilation.  LHC/RHC in 4/21 with no CAD, preserved cardiac output.  Etiology is uncertain, she does not use ETOH/drugs, no definite family history. Possible viral myocarditis. NYHA class II symptoms; not volume overloaded on exam. Medication titration has been limited by low BP and lightheadedness though this seems improved.  - Increase Toprol XL to  25 mg bid.  - Continue Lasix 40 qam/20 qpm.  BMET today.  - Increase spironolactone to 25 mg daily, BMET today and in 10 days.  - Continue Entresto 24/26 bid.   - Continue Corlanor 5 mg bid.  - She will get cardiac MRI to assess for infiltrative disease/myocarditis (missed last appt, will reschedule).   - If EF remains < 35%, will need to consider ICD. QRS not wide, not CRT candidate.  2. Pericardial effusion: Small-moderate pericardial effusion on 3/21 echo, no tamponade.  Will reassess on cardiac MRI.   Followup with CHF pharmacist in 3 wks, then see me in 2 months  Marca Ancona 04/03/2020

## 2020-04-03 NOTE — ED Triage Notes (Signed)
Pt arrives POV for eval of "feeling like something is moving in my leg". Unable to elaborate or describe further. Denies pain, no swelling, +CSM. Denies similar hx. Pt points to calf muscle and states "something is moving in there".

## 2020-04-03 NOTE — ED Notes (Signed)
Pt checked out AMA. 

## 2020-04-05 ENCOUNTER — Other Ambulatory Visit (HOSPITAL_COMMUNITY): Payer: Self-pay | Admitting: *Deleted

## 2020-04-12 ENCOUNTER — Other Ambulatory Visit: Payer: Self-pay

## 2020-04-12 ENCOUNTER — Ambulatory Visit (HOSPITAL_COMMUNITY)
Admission: RE | Admit: 2020-04-12 | Discharge: 2020-04-12 | Disposition: A | Discharge status: Home / Self Care | Payer: Medicare Other | Source: Ambulatory Visit | Attending: Cardiology | Admitting: Cardiology

## 2020-04-12 DIAGNOSIS — I5022 Chronic systolic (congestive) heart failure: Secondary | ICD-10-CM | POA: Diagnosis present

## 2020-04-12 LAB — BASIC METABOLIC PANEL
Anion gap: 9 (ref 5–15)
BUN: 14 mg/dL (ref 8–23)
CO2: 26 mmol/L (ref 22–32)
Calcium: 9.4 mg/dL (ref 8.9–10.3)
Chloride: 106 mmol/L (ref 98–111)
Creatinine, Ser: 0.83 mg/dL (ref 0.44–1.00)
GFR calc Af Amer: >60 mL/min (ref >60)
GFR calc non Af Amer: >60 mL/min (ref >60)
Glucose, Bld: 104 mg/dL — ABNORMAL HIGH (ref 70–99)
Potassium: 4.3 mmol/L (ref 3.5–5.1)
Sodium: 141 mmol/L (ref 135–145)

## 2020-04-17 ENCOUNTER — Other Ambulatory Visit (HOSPITAL_COMMUNITY): Payer: Self-pay | Admitting: Cardiology

## 2020-04-25 ENCOUNTER — Other Ambulatory Visit (HOSPITAL_COMMUNITY): Payer: Self-pay | Admitting: *Deleted

## 2020-04-25 MED ORDER — DAPAGLIFLOZIN PROPANEDIOL 10 MG PO TABS
10.0000 mg | ORAL_TABLET | Freq: Every day | ORAL | 11 refills | Status: DC
Start: 2020-04-25 — End: 2020-05-23

## 2020-05-02 ENCOUNTER — Inpatient Hospital Stay (HOSPITAL_COMMUNITY)
Admission: RE | Admit: 2020-05-02 | Discharge: 2020-05-02 | Disposition: A | Discharge status: Home / Self Care | Payer: Medicare Other | Source: Ambulatory Visit

## 2020-05-03 ENCOUNTER — Telehealth (HOSPITAL_COMMUNITY): Payer: Self-pay | Admitting: Cardiology

## 2020-05-03 NOTE — Telephone Encounter (Signed)
Pt called this morning, forgot about appt yesterday, r/s to 09/23, she wants to know if you can see her sooner. Please advise

## 2020-05-19 ENCOUNTER — Other Ambulatory Visit: Payer: Self-pay

## 2020-05-19 ENCOUNTER — Ambulatory Visit (HOSPITAL_COMMUNITY)
Admission: RE | Admit: 2020-05-19 | Discharge: 2020-05-19 | Disposition: A | Discharge status: Home / Self Care | Payer: Medicare HMO | Source: Ambulatory Visit | Attending: Internal Medicine | Admitting: Internal Medicine

## 2020-05-19 VITALS — BP 122/64 | HR 54 | Wt 157.4 lb

## 2020-05-19 DIAGNOSIS — I428 Other cardiomyopathies: Secondary | ICD-10-CM | POA: Diagnosis not present

## 2020-05-19 DIAGNOSIS — Z79899 Other long term (current) drug therapy: Secondary | ICD-10-CM | POA: Diagnosis not present

## 2020-05-19 DIAGNOSIS — R42 Dizziness and giddiness: Secondary | ICD-10-CM | POA: Diagnosis present

## 2020-05-19 DIAGNOSIS — I313 Pericardial effusion (noninflammatory): Secondary | ICD-10-CM | POA: Insufficient documentation

## 2020-05-19 DIAGNOSIS — Z7901 Long term (current) use of anticoagulants: Secondary | ICD-10-CM | POA: Diagnosis not present

## 2020-05-19 DIAGNOSIS — I5022 Chronic systolic (congestive) heart failure: Secondary | ICD-10-CM | POA: Insufficient documentation

## 2020-05-19 MED ORDER — METOPROLOL SUCCINATE ER 50 MG PO TB24: ORAL_TABLET | ORAL | 3 refills | Status: DC

## 2020-05-19 NOTE — Patient Instructions (Addendum)
It was a pleasure seeing you today!  MEDICATIONS: -We are changing your medications today -Stop ivabradine (Corlanor) -Increase metoprolol succinate to 25 mg (1/2 tablet) every morning and 50 mg (1 tablet) every night. You may take 1 tablet of the 25 mg strength every morning and 2 tablets of the 25 mg strength every night until you pick up the new strength.  -Call if you have questions about your medications.   NEXT APPOINTMENT: Return to clinic in 3 weeks with Dr. Shirlee Latch.  In general, to take care of your heart failure: -Limit your fluid intake to 2 Liters (half-gallon) per day.   -Limit your salt intake to ideally 2-3 grams (2000-3000 mg) per day. -Weigh yourself daily and record, and bring that "weight diary" to your next appointment.  (Weight gain of 2-3 pounds in 1 day typically means fluid weight.) -The medications for your heart are to help your heart and help you live longer.   -Please contact us before stopping any of your heart medications.  Call the clinic at [phone] with questions or to reschedule future appointments.

## 2020-05-19 NOTE — Progress Notes (Signed)
PCP: Hoy Register, MD HF Cardiology: Dr. Shirlee Latch  HPI: 69 y.o. with chronic systolic CHF who was referred by Dr. [name] to Dr. Shirlee Latch for evaluation of CHF. Patient had no known cardiac history prior to 11/22/19. 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 Several weeks prior to 11/22/19 admission, she began to note exertional dyspnea. This problem worsened to shortness of breath with minimal exertion and severe orthopnea.  She was admitted in 11/22/19 with dyspnea, CXR showed pulmonary edema and echo showed EF <20% with moderate LV dilation. During hospitalization, low BP limited medication titration. RHC/LHC showed no coronary disease and preserved cardiac output.   Recently presented to HF Clinic on 04/01/20 with Dr. Shirlee Latch. Weight was stable. Doing well symptomatically, short of breath only with fast walking.  No orthopnea/PND.  No lightheadedness or dizziness. BP was stable in clinic. She was walking on a track for exercise  Today she returns to HF clinic for pharmacist medication titration. At last visit with MD, metoprolol succinate was increased to 25 mg BID and spironolactone was increased to 25 mg daily. Overall she is feeling well today. Notes some occasional dizziness when standing. Although this is not new, it is worse since her medications were increased last time. No chest pain or palpitations. No fatigue. No SOB/DOE. Walks 1-2 miles per day. She takes furosemide 40 mg AM/20 mg PM and has not needed any extra. No LEE, PND or orthopnea. Her BP in clinic today was 122/64, but she had not taken her Entresto, spironolactone, or metoprolol succinate yet.   HF Medications: - Furosemide 40mg  AM / 20mg  PM - Metoprolol succinate 25 mg BID - Entresto 24/26 mg BID - Spironolactone 12.5 mg daily - Dapagliflozin 10 mg daily - Ivabradine 5 mg BID  Has the patient been experiencing any side effects to the medications prescribed?  no  Does the patient have  any problems obtaining medications due to transportation or finances? No - patient has .  Understanding of regimen: good Understanding of indications: good Potential of compliance: good Patient understands to avoid NSAIDs. Patient understands to avoid decongestants.    Pertinent Lab Values (04/12/20): [employer] Serum creatinine 0.83, BUN 14, Potassium 4.3, Sodium 141  Vital Signs: . Weight: 157.4 lbs (last clinic weight: 153.2 lbs) . Blood pressure: 122/64 mmHg (did not take Entresto, spironolactone or metoprolol succinate this AM) . Heart rate: 54 BPM   Assessment: 1. Chronic systolic CHF: Nonischemic cardiomyopathy.  Echo in 3/21 with EF < 20%, moderate LV dilation.  LHC/RHC in 4/21 with no CAD, preserved cardiac output.  Etiology is uncertain, she does not use ETOH/drugs, no definite family history. Possible viral myocarditis.  -NYHA class II-III symptoms; euvolemic on exam - Vitals: BP 122/64 mmHg (did not take Entresto, spironolactone or metoprolol succinate this AM); HR 54 - Continue furosemide 40 qam/20 qpm. - Increase metoprolol succinate to 25 mg AM/50 mg PM - Continue Entresto 24/26 mg BID.  - Continue spironolactone 25 mg daily.  - Continue dapagliflozin 10 mg daily. - Stop ivabradine 5 mg BID given lower HR to make room for metoprolol succinate uptitration.  - Cardiac MRI to assess for infiltrative disease/myocarditis per MD  - If EF remains < 35%, will need to consider ICD. QRS not wide, not CRT candidate. 2. Pericardial effusion: Small-moderate pericardial effusion on 3/21 echo, no tamponade. MD to reassess on cardiac MRI.    Plan: 1) Medication changes: Based on clinical presentation, vital signs and recent labs will  Increase metoprolol succinate to 25 mg AM/50 mg PM and stop ivabradine to make room for metoprolol succinate uptitration.  2) Follow-up: 3 weeks with Dr. Jonn Shingles, PharmD, BCPS, [facility], CPP Heart Failure Clinic  Pharmacist [phone]

## 2020-05-20 ENCOUNTER — Other Ambulatory Visit (HOSPITAL_COMMUNITY): Payer: Self-pay | Admitting: *Deleted

## 2020-05-23 ENCOUNTER — Other Ambulatory Visit (HOSPITAL_COMMUNITY): Payer: Self-pay | Admitting: *Deleted

## 2020-05-23 MED ORDER — DAPAGLIFLOZIN PROPANEDIOL 10 MG PO TABS
10.0000 mg | ORAL_TABLET | Freq: Every day | ORAL | 11 refills | Status: DC
Start: 2020-05-23 — End: 2020-06-14

## 2020-05-26 ENCOUNTER — Other Ambulatory Visit (HOSPITAL_COMMUNITY): Payer: Medicare Other

## 2020-06-06 ENCOUNTER — Ambulatory Visit (HOSPITAL_COMMUNITY)
Admission: RE | Admit: 2020-06-06 | Discharge: 2020-06-06 | Disposition: A | Discharge status: Home / Self Care | Payer: Medicare HMO | Source: Ambulatory Visit | Attending: Cardiology | Admitting: Cardiology

## 2020-06-06 ENCOUNTER — Other Ambulatory Visit: Payer: Self-pay

## 2020-06-06 ENCOUNTER — Encounter (HOSPITAL_COMMUNITY): Payer: Self-pay | Admitting: Cardiology

## 2020-06-06 VITALS — BP 108/70 | HR 78 | Ht 63.0 in | Wt 158.0 lb

## 2020-06-06 DIAGNOSIS — I5022 Chronic systolic (congestive) heart failure: Secondary | ICD-10-CM | POA: Diagnosis not present

## 2020-06-06 DIAGNOSIS — Z7984 Long term (current) use of oral hypoglycemic drugs: Secondary | ICD-10-CM | POA: Insufficient documentation

## 2020-06-06 DIAGNOSIS — K219 Gastro-esophageal reflux disease without esophagitis: Secondary | ICD-10-CM | POA: Diagnosis not present

## 2020-06-06 DIAGNOSIS — I313 Pericardial effusion (noninflammatory): Secondary | ICD-10-CM | POA: Insufficient documentation

## 2020-06-06 DIAGNOSIS — I428 Other cardiomyopathies: Secondary | ICD-10-CM | POA: Insufficient documentation

## 2020-06-06 DIAGNOSIS — Z79899 Other long term (current) drug therapy: Secondary | ICD-10-CM | POA: Diagnosis not present

## 2020-06-06 LAB — BASIC METABOLIC PANEL
Anion gap: 7 (ref 5–15)
BUN: 18 mg/dL (ref 8–23)
CO2: 23 mmol/L (ref 22–32)
Calcium: 8.8 mg/dL — ABNORMAL LOW (ref 8.9–10.3)
Chloride: 111 mmol/L (ref 98–111)
Creatinine, Ser: 1 mg/dL (ref 0.44–1.00)
GFR calc Af Amer: >60 mL/min (ref >60)
GFR calc non Af Amer: 57 mL/min — ABNORMAL LOW (ref >60)
Glucose, Bld: 105 mg/dL — ABNORMAL HIGH (ref 70–99)
Potassium: 4.1 mmol/L (ref 3.5–5.1)
Sodium: 141 mmol/L (ref 135–145)

## 2020-06-06 LAB — BRAIN NATRIURETIC PEPTIDE: B Natriuretic Peptide: 83 pg/mL (ref 0.0–100.0)

## 2020-06-06 MED ORDER — FUROSEMIDE 20 MG PO TABS
40.0000 mg | ORAL_TABLET | Freq: Every day | ORAL | 5 refills | Status: DC
Start: 2020-06-06 — End: 2020-08-05

## 2020-06-06 MED ORDER — SACUBITRIL-VALSARTAN 49-51 MG PO TABS: 1.0000 | ORAL_TABLET | Freq: Two times a day (BID) | ORAL | 5 refills | Status: DC

## 2020-06-06 NOTE — Patient Instructions (Addendum)
DECREASE Lasix to 40mg  daily  INCREASE Entresto to 49/51mg  (1 tab) twice a day  You will get a call to schedule your MRI.  Labs today and repeat in 10 days We will only contact you if something comes back abnormal or we need to make some changes. Otherwise no news is good news!  Your physician recommends that you schedule a follow-up appointment in: 1 month with the pharmacist and 3 months with Dr  Please call office at [phone] option [phone] if you have any questions or concerns.   At the [facility], you and your health needs are our priority. As part of our continuing mission to provide you with exceptional heart care, we have created designated Provider Care Teams. These Care Teams include your primary Cardiologist (physician) and Advanced Practice Providers (APPs- Physician Assistants and Nurse Practitioners) who all work together to provide you with the care you need, when you need it.   You may see any of the following providers on your designated Care Team at your next follow up: [phone] Dr Marland Kitchen . Dr Arvilla Meres . Marca Ancona, NP . Tonye Becket, PA . Robbie Lis, PharmD   Please be sure to bring in all your medications bottles to every appointment.

## 2020-06-07 NOTE — Progress Notes (Signed)
PCP: Robin Register, MD HF Cardiology: Dr. Shirlee Patel  69 y.o. with chronic systolic CHF was referred by Dr. Jens Patel for evaluation of CHF.  Patient had no known cardiac history prior to 3/21.  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  Several weeks prior to 3/21 admission, she began to note exertional dyspnea.  This worsened to shortness of breath with minimal exertion and severe orthopnea.  She was admitted in 3/21 with dyspnea, CXR showed pulmonary edema and echo showed EF <20% with moderate LV dilation.  Low BP limited medication titration.  RHC/LHC showed no coronary disease, preserved cardiac output.  She was discharged home.    She returns for followup of CHF.  She can walk a mile without dyspnea.  Mild lightheadedness right after she takes her meds but not something that bothers her.  Still seems to have some orthopnea, keeps head of bed raised.  Weight up several lbs.  No chest pain.   Labs (3/21): LDL 90 Labs (4/21): K 3.9, creatinine 0.89 => 0.94, TSH normal, BNP 355 Labs (6/21): K 3.7, creatinine 0.86 Labs (8/21): K 4.3, creatinine 0.83  PMH:  1. GERD 2. Chronic systolic CHF: Diagnosed 3/21.  Nonischemic cardiomyopathy.  - Echo (3/21): EF < 20%, moderate LV dilation, normal RV, mild-moderate pericardial effusion.  - RHC/LHC (4/21): No CAD; mean RA 6, PA 39/19, mean PCWP 17, CI 2.67  FH: Parents with MIs in their 60s, sister with "enlarged heart."    SH: Married with 5 kids. No ETOH, drugs.  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   ROS: All systems reviewed and negative except as per HPI.   Current Outpatient Medications  Medication Sig Dispense Refill   dapagliflozin propanediol (FARXIGA) 10 MG TABS tablet Take 1 tablet (10 mg total) by mouth daily before breakfast. 30 tablet 11   famotidine (PEPCID) 20 MG tablet TAKE 1 TABLET BY MOUTH TWICE A DAY 180 tablet 3   furosemide (LASIX) 20 MG tablet Take 2 tablets  (40 mg total) by mouth daily. 60 tablet 5   metoprolol succinate (TOPROL-XL) 50 MG 24 hr tablet Take 25 mg (1/2 tab) every morning and 50 mg (1 tab) every night 135 tablet 3   Multiple Vitamins-Minerals (MULTIVITAMIN WITH MINERALS) tablet Take 1 tablet by mouth daily.     pantoprazole (PROTONIX) 40 MG tablet TAKE 1 TABLET BY MOUTH EVERY DAY 90 tablet 1   potassium chloride (KLOR-CON) 10 MEQ tablet Take 1 tablet (10 mEq total) by mouth daily. 30 tablet 3   spironolactone (ALDACTONE) 25 MG tablet Take 1 tablet (25 mg total) by mouth daily. 90 tablet 3   dimenhyDRINATE (DRAMAMINE PO) Take by mouth daily as needed. (Patient not taking: Reported on 06/06/2020)     sacubitril-valsartan (ENTRESTO) 49-51 MG Take 1 tablet by mouth 2 (two) times daily. 60 tablet 5   No current facility-administered medications for this encounter.   BP 108/70 (BP Location: Left Arm, Patient Position: Sitting, Cuff Size: Normal)    Pulse 78    Ht 5\' 3"  (1.6 m)    Wt 71.7 kg (158 lb)    SpO2 97%    BMI 27.99 kg/m  General: NAD Neck: No JVD, no thyromegaly or thyroid nodule.  Lungs: Clear to auscultation bilaterally with normal respiratory effort. CV: Nondisplaced PMI.  Heart regular S1/S2, no S3/S4, no murmur.  No peripheral edema.  No carotid bruit.  Normal pedal pulses.  Abdomen: Soft, nontender, no hepatosplenomegaly,  no distention.  Skin: Intact without lesions or rashes.  Neurologic: Alert and oriented x 3.  Psych: Normal affect. Extremities: No clubbing or cyanosis.  HEENT: Normal.   Assessment/Plan: 1. Chronic systolic CHF: Nonischemic cardiomyopathy.  Echo in 3/21 with EF < 20%, moderate LV dilation.  LHC/RHC in 4/21 with no CAD, preserved cardiac output.  Etiology is uncertain, she does not use ETOH/drugs, no definite family history. Possible viral myocarditis. NYHA class II symptoms; volume status looks ok. Medication titration has been limited by low BP and lightheadedness though this seems improved.  -  Continue Toprol XL 25 qam/50 qpm.   - Continue spironolactone 25 mg daily.   - Increase Entresto to 49/51 bid, BMET/BNP today and repeat BMET in 10 days.  Can decrease Lasix to 40 mg daily with increase in Entresto.  - She will get cardiac MRI to assess for infiltrative disease/myocarditis (still need to schedule).   - If EF remains < 35%, will need to consider ICD. QRS not wide, not CRT candidate.  2. Pericardial effusion: Small-moderate pericardial effusion on 3/21 echo, no tamponade.  Will reassess on cardiac MRI.   Followup with CHF pharmacist in 3 wks, then see me in 2 months  Robin Patel 06/07/2020

## 2020-06-14 ENCOUNTER — Other Ambulatory Visit (HOSPITAL_COMMUNITY): Payer: Self-pay | Admitting: *Deleted

## 2020-06-14 MED ORDER — DAPAGLIFLOZIN PROPANEDIOL 10 MG PO TABS
10.0000 mg | ORAL_TABLET | Freq: Every day | ORAL | 11 refills | Status: DC
Start: 2020-06-14 — End: 2020-09-20

## 2020-06-17 ENCOUNTER — Ambulatory Visit (HOSPITAL_COMMUNITY)
Admission: RE | Admit: 2020-06-17 | Discharge: 2020-06-17 | Disposition: A | Discharge status: Home / Self Care | Payer: Medicare HMO | Source: Ambulatory Visit | Attending: Internal Medicine | Admitting: Internal Medicine

## 2020-06-17 ENCOUNTER — Other Ambulatory Visit: Payer: Self-pay

## 2020-06-17 DIAGNOSIS — I5022 Chronic systolic (congestive) heart failure: Secondary | ICD-10-CM | POA: Insufficient documentation

## 2020-06-17 LAB — BASIC METABOLIC PANEL
Anion gap: 8 (ref 5–15)
BUN: 16 mg/dL (ref 8–23)
CO2: 27 mmol/L (ref 22–32)
Calcium: 9.5 mg/dL (ref 8.9–10.3)
Chloride: 105 mmol/L (ref 98–111)
Creatinine, Ser: 0.99 mg/dL (ref 0.44–1.00)
GFR, Estimated: 58 mL/min — ABNORMAL LOW (ref >60)
Glucose, Bld: 114 mg/dL — ABNORMAL HIGH (ref 70–99)
Potassium: 3.6 mmol/L (ref 3.5–5.1)
Sodium: 140 mmol/L (ref 135–145)

## 2020-06-24 ENCOUNTER — Telehealth (HOSPITAL_COMMUNITY): Payer: Self-pay | Admitting: Pharmacist

## 2020-06-24 ENCOUNTER — Telehealth (HOSPITAL_COMMUNITY): Payer: Self-pay | Admitting: Pharmacy Technician

## 2020-06-24 MED ORDER — ENTRESTO 24-26 MG PO TABS: 1.0000 | ORAL_TABLET | Freq: Two times a day (BID) | ORAL | 11 refills | Status: DC

## 2020-06-24 NOTE — Telephone Encounter (Signed)
Sent in application via fax.  Will follow up.  

## 2020-06-24 NOTE — Telephone Encounter (Signed)
Spoke with patient who complained of breaking out in hives with increased dose of Entresto 49/51 mg BID. She stated she took one dose of the medication and broke out in itchy hives all over her body. She had previously been on the 24/26 mg BID strength since May with no issues. After speaking with patient, will have her hold Entresto for 3 days, then decrease back to the 24/26 mg BID strength. I will send new dose of Entresto to her pharmacy.   Karle Plumber, PharmD, BCPS, BCCP, CPP Heart Failure Clinic Pharmacist [phone]

## 2020-06-24 NOTE — Telephone Encounter (Signed)
Started an Land[employer] for [employer] assistance for the patient. The application is at the check in desk along with a bottle of samples.  Will follow up.

## 2020-06-28 NOTE — Telephone Encounter (Signed)
Advanced Heart Failure Patient Advocate Encounter   Patient was approved to receive Entresto from [employer]  Patient ID: [id number] Effective dates: 06/28/20 through 09/02/20

## 2020-06-28 NOTE — Telephone Encounter (Signed)
I have spoken with the patient regarding the approval. ° °Robin Patel F Robin Patel, CPhT ° °

## 2020-07-07 ENCOUNTER — Other Ambulatory Visit: Payer: Self-pay

## 2020-07-07 ENCOUNTER — Ambulatory Visit (HOSPITAL_COMMUNITY)
Admission: RE | Admit: 2020-07-07 | Discharge: 2020-07-07 | Disposition: A | Discharge status: Home / Self Care | Payer: Medicare HMO | Source: Ambulatory Visit | Attending: Internal Medicine | Admitting: Internal Medicine

## 2020-07-07 VITALS — BP 110/68 | HR 73 | Wt 157.6 lb

## 2020-07-07 DIAGNOSIS — Z5181 Encounter for therapeutic drug level monitoring: Secondary | ICD-10-CM | POA: Diagnosis not present

## 2020-07-07 DIAGNOSIS — I5022 Chronic systolic (congestive) heart failure: Secondary | ICD-10-CM | POA: Diagnosis not present

## 2020-07-07 DIAGNOSIS — Z79899 Other long term (current) drug therapy: Secondary | ICD-10-CM | POA: Insufficient documentation

## 2020-07-07 DIAGNOSIS — I313 Pericardial effusion (noninflammatory): Secondary | ICD-10-CM | POA: Insufficient documentation

## 2020-07-07 DIAGNOSIS — Z7984 Long term (current) use of oral hypoglycemic drugs: Secondary | ICD-10-CM | POA: Insufficient documentation

## 2020-07-07 MED ORDER — METOPROLOL SUCCINATE ER 50 MG PO TB24
50.0000 mg | ORAL_TABLET | Freq: Two times a day (BID) | ORAL | 3 refills | Status: DC
Start: 2020-07-07 — End: 2020-09-06

## 2020-07-07 MED ORDER — POTASSIUM CHLORIDE CRYS ER 10 MEQ PO TBCR
10.0000 meq | EXTENDED_RELEASE_TABLET | Freq: Every day | ORAL | 3 refills | Status: DC
Start: 2020-07-07 — End: 2020-09-20

## 2020-07-07 NOTE — Patient Instructions (Signed)
It was a pleasure seeing you today!  MEDICATIONS: -We are changing your medications today  - Increase metoprolol succinate to 50 mg (1 tablet) twice daily.  -Call if you have questions about your medications.  NEXT APPOINTMENT: Return to clinic in 2 months with Dr. McLean.  In general, to take care of your heart failure: -Limit your fluid intake to 2 Liters (half-gallon) per day.   -Limit your salt intake to ideally 2-3 grams (2000-3000 mg) per day. -Weigh yourself daily and record, and bring that "weight diary" to your next appointment.  (Weight gain of 2-3 pounds in 1 day typically means fluid weight.) -The medications for your heart are to help your heart and help you live longer.   -Please contact us before stopping any of your heart medications.  Call the clinic at [phone] with questions or to reschedule future appointments.  

## 2020-07-07 NOTE — Progress Notes (Signed)
PCP: Hoy Register, MD HF Cardiology: Dr. Shirlee Latch  HPI:  69 y.o. with chronic systolic CHF was referred by Dr. [name] for evaluation of CHF.  Patient had no known cardiac history prior to 3/21.  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  Several weeks prior to 3/21 admission, she began to note exertional dyspnea.  This worsened to shortness of breath with minimal exertion and severe orthopnea.  She was admitted in 3/21 with dyspnea. CXR showed pulmonary edema and echo showed EF <20% with moderate LV dilation.  Low BP limited medication titration.  RHC/LHC showed no coronary disease, preserved cardiac output.  She was discharged home.    She recently returned for followup of CHF with Dr. Shirlee Latch on 06/06/20.  She was able to walk a mile without dyspnea.  Reported mild lightheadedness right after she took medications, but this was not something that bothered her.  Still seemed to have some orthopnea and kept the head of bed raised.  Weight was up several lbs.  No chest pain.  Today she returns to HF clinic for pharmacist medication titration. At last visit with MD, furosemide was decreased to 40 mg daily and Entresto was increased to 49/51 mg BID. Since then, she broke out into hives with the increased dose of Entresto, so she was instructed to hold for 3 days and to decrease back to 24/26 mg BID strength. Today, she is feeling well overall and reports her hives resolved upon stopping the higher Entresto dose. She continues to have some dizziness right after taking her medications, but this is stable. Denies lightheadedness or fatigue. No chest pain or palpitations. Reports her breathing is "perfect" and she is able to walk 0.6 miles before feeling SOB. Weight stable at 150-154 lbs at home. The head of her bed is set at a 45 degree angle and she uses 1 pillow. She is a vegetarian and follows a low-salt diet.  HF Medications: Metoprolol succinate 25 mg AM/50 mg  PM Entresto 24/26 mg BID Spironolactone 25 mg daily Farxiga (dapagliflozin) 10 mg daily Furosemide 40 mg daily Potassium Chloride 10 mEq daily  Has the patient been experiencing any side effects to the medications prescribed?  No - hives resolved upon stopping Entresto 49/51 mg strength  Does the patient have any problems obtaining medications due to transportation or finances?   No - Receives PANF [employer] and [employer] PAP for [employer] of regimen: good Understanding of indications: good Potential of compliance: good Patient understands to avoid NSAIDs. Patient understands to avoid decongestants.    Pertinent Lab Values:  06/17/20: Serum creatinine 0.99, BUN 16, Potassium 3.6, Sodium 140  Vital Signs:  Weight: 157 lbs (last clinic weight: 158 lbs)  Blood pressure: 110/68   Heart rate: 73   Assessment: 1. Chronic systolic CHF: Nonischemic cardiomyopathy.  Echo in 3/21 with EF < 20%, moderate LV dilation.  LHC/RHC in 4/21 with no CAD, preserved cardiac output.  Etiology is uncertain, she does not use ETOH/drugs, no definite family history. Possible viral myocarditis. Medication titration has been limited by low BP and lightheadedness though this seems improved.  - NYHA class II symptoms; euvolemic on exam. - Continue furosemide 40 mg daily - Increase metoprolol succinate to 50 mg BID - Continue Entresto 24/26 mg BID - Continue spironolactone 25 mg daily - Continue Farxiga 10 mg daily - Continue potassium chloride 10 mEq daily. Refill sent to pharmacy. - Previously planned to get cardiac MRI to assess  for infiltrative disease/myocarditis (still need to schedule).   - If EF remains < 35%, will need to consider ICD. QRS not wide, not CRT candidate.   2. Pericardial effusion: Small-moderate pericardial effusion on 3/21 echo, no tamponade.  Plans noted to reassess on cardiac MRI.    Plan: 1) Medication changes: Based on clinical presentation, vital signs and recent  labs will increase metoprolol succinate to 50 mg BID 2) Labs: None needed 3) Follow-up: Appointment with Dr. Shirlee Latch on 09/06/20  Tama Headings, PharmD PGY2 Cardiology Pharmacy Resident  Karle Plumber, PharmD, BCPS, BCCP, CPP Heart Failure Clinic Pharmacist [phone]

## 2020-07-08 ENCOUNTER — Encounter (HOSPITAL_COMMUNITY): Payer: Self-pay

## 2020-07-08 ENCOUNTER — Telehealth (HOSPITAL_COMMUNITY): Payer: Self-pay | Admitting: Licensed Clinical Social Worker

## 2020-07-08 NOTE — Telephone Encounter (Signed)
CSW consulted by pharmacy team following pharmacy appt yesterday to help address disability questions that patient had.  Pt reports that she applied for disability awhile ago and is currently in the appeals process with the help of a lawyer.  States she originally applied for disability based on a hand injury but that she applied prior to her heart failure because an issue back in march.  With EF of less than 20% it would be likely she could be eligible for disability if it continues to be this low.  Lawyer told patient she needs to get a letter from our office stating she was not appropriate to work because of her heart.  CSW took request to clinic staff- they will discuss with MD and mail pt letter if he is in agreement.  CSW confirmed pt is getting retirement benefits and between those benefits and her husbands income they are managing household expenses ok and has been able to upkeep household and grocery expenses despite lost income.  CSW will continue to follow and assist as needed  Burna Sis, LCSW Clinical Social Worker [facility] Desk#: [phone] Cell#: [phone]

## 2020-07-19 ENCOUNTER — Telehealth (HOSPITAL_COMMUNITY): Payer: Self-pay | Admitting: *Deleted

## 2020-07-19 NOTE — Telephone Encounter (Signed)
Pt left vm stating there has been a mix up in her insurance and requested a call back. I called patient back to get more information patient did not answer. Left vm requesting return call.

## 2020-07-23 ENCOUNTER — Telehealth: Payer: Self-pay | Admitting: Cardiology

## 2020-07-23 NOTE — Telephone Encounter (Signed)
Received outpatient page regarding patient needing refill however on return call there was no answer.  Left voice message for call back.  Robin Chard NP-C [facility] Pager: [phone]

## 2020-08-05 ENCOUNTER — Other Ambulatory Visit (HOSPITAL_COMMUNITY): Payer: Self-pay | Admitting: *Deleted

## 2020-08-05 MED ORDER — FUROSEMIDE 20 MG PO TABS: 40.0000 mg | ORAL_TABLET | Freq: Every day | ORAL | 5 refills | Status: DC

## 2020-08-22 ENCOUNTER — Encounter (HOSPITAL_COMMUNITY): Payer: Self-pay

## 2020-08-22 ENCOUNTER — Telehealth (HOSPITAL_COMMUNITY): Payer: Self-pay | Admitting: Pharmacy Technician

## 2020-08-22 NOTE — Progress Notes (Signed)
Medication Samples have been provided to the patient.  Drug name: Marcelline Deist       Strength: 10mg         Qty: 4 boxes (7 tablets each)  LOT:  Exp.Date: 04/03/23  Dosing instructions: 1 tablet daily  The patient has been instructed regarding the correct time, dose, and frequency of taking this medication, including desired effects and most common side effects.   04/05/23 4:13 PM 08/22/2020

## 2020-08-22 NOTE — Telephone Encounter (Signed)
Spoke to patient regarding re-enrollment of Sherryll Burger and [place] assistance with [employer] and [employer]. Will have the application at the check in desk for the patient to sign.   Will fax once signatures are obtained.

## 2020-08-25 NOTE — Telephone Encounter (Signed)
Sent in [employer] application via fax.  Will follow up.  Will attempt to re-enroll patient for [employer] via phone.

## 2020-09-06 ENCOUNTER — Other Ambulatory Visit: Payer: Self-pay

## 2020-09-06 ENCOUNTER — Telehealth (HOSPITAL_COMMUNITY): Payer: Self-pay

## 2020-09-06 ENCOUNTER — Ambulatory Visit (HOSPITAL_COMMUNITY)
Admission: RE | Admit: 2020-09-06 | Discharge: 2020-09-06 | Disposition: A | Discharge status: Home / Self Care | Payer: Medicare HMO | Source: Ambulatory Visit | Attending: Cardiology | Admitting: Cardiology

## 2020-09-06 ENCOUNTER — Encounter (HOSPITAL_COMMUNITY): Payer: Self-pay | Admitting: Cardiology

## 2020-09-06 VITALS — BP 124/78 | HR 71 | Ht 63.0 in | Wt 158.8 lb

## 2020-09-06 DIAGNOSIS — I313 Pericardial effusion (noninflammatory): Secondary | ICD-10-CM | POA: Diagnosis not present

## 2020-09-06 DIAGNOSIS — I5022 Chronic systolic (congestive) heart failure: Secondary | ICD-10-CM | POA: Insufficient documentation

## 2020-09-06 DIAGNOSIS — Z79899 Other long term (current) drug therapy: Secondary | ICD-10-CM | POA: Insufficient documentation

## 2020-09-06 DIAGNOSIS — I428 Other cardiomyopathies: Secondary | ICD-10-CM | POA: Diagnosis not present

## 2020-09-06 LAB — BASIC METABOLIC PANEL
Anion gap: 10 (ref 5–15)
BUN: 14 mg/dL (ref 8–23)
CO2: 23 mmol/L (ref 22–32)
Calcium: 9.7 mg/dL (ref 8.9–10.3)
Chloride: 104 mmol/L (ref 98–111)
Creatinine, Ser: 0.82 mg/dL (ref 0.44–1.00)
GFR, Estimated: >60 mL/min (ref >60)
Glucose, Bld: 106 mg/dL — ABNORMAL HIGH (ref 70–99)
Potassium: 4.1 mmol/L (ref 3.5–5.1)
Sodium: 137 mmol/L (ref 135–145)

## 2020-09-06 MED ORDER — METOPROLOL SUCCINATE ER 50 MG PO TB24
75.0000 mg | ORAL_TABLET | Freq: Two times a day (BID) | ORAL | 3 refills | Status: DC
Start: 2020-09-06 — End: 2020-12-16

## 2020-09-06 NOTE — Progress Notes (Signed)
PCP: Hoy Register, MD HF Cardiology: Dr. Shirlee Latch  70 y.o. with chronic systolic CHF was referred by Dr. [name] for evaluation of CHF.  Patient had no known cardiac history prior to 3/21.  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  Several weeks prior to 3/21 admission, she began to note exertional dyspnea.  This worsened to shortness of breath with minimal exertion and severe orthopnea.  She was admitted in 3/21 with dyspnea, CXR showed pulmonary edema and echo showed EF <20% with moderate LV dilation.  Low BP limited medication titration.  RHC/LHC showed no coronary disease, preserved cardiac output.  She was discharged home.    She says that she got "hives" when Sherryll Burger was increased to 49/51 but resolved when Entresto was decreased back to 24/26 bid.   She returns for followup of CHF.  She has been doing well recently.  Weight is stable.  She is short of breath with a flight of stairs, no problems on flat ground.  No chest pain.  Rare lightheadedness with standing.   ECG (personally reviewed): NSR, old ASMI  Labs (3/21): LDL 90 Labs (4/21): K 3.9, creatinine 0.89 => 0.94, TSH normal, BNP 355 Labs (6/21): K 3.7, creatinine 0.86 Labs (8/21): K 4.3, creatinine 0.83 Labs (10/21): K 3.6, creatinine 0.99, BNP 83  PMH:  1. GERD 2. Chronic systolic CHF: Diagnosed 3/21.  Nonischemic cardiomyopathy.  - Echo (3/21): EF < 20%, moderate LV dilation, normal RV, mild-moderate pericardial effusion.  - RHC/LHC (4/21): No CAD; mean RA 6, PA 39/19, mean PCWP 17, CI 2.67  FH: Parents with MIs in their 60s, sister with "enlarged heart."    SH: Married with 5 kids. No ETOH, drugs.  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   ROS: All systems reviewed and negative except as per HPI.   Current Outpatient Medications  Medication Sig Dispense Refill  . dapagliflozin propanediol (FARXIGA) 10 MG TABS tablet Take 1 tablet (10 mg total) by  mouth daily before breakfast. 30 tablet 11  . famotidine (PEPCID) 20 MG tablet TAKE 1 TABLET BY MOUTH TWICE A DAY 180 tablet 3  . furosemide (LASIX) 20 MG tablet Take 2 tablets (40 mg total) by mouth daily. 60 tablet 5  . Multiple Vitamins-Minerals (MULTIVITAMIN WITH MINERALS) tablet Take 1 tablet by mouth daily.    . pantoprazole (PROTONIX) 40 MG tablet TAKE 1 TABLET BY MOUTH EVERY DAY 90 tablet 1  . potassium chloride (KLOR-CON) 10 MEQ tablet Take 1 tablet (10 mEq total) by mouth daily. 90 tablet 3  . sacubitril-valsartan (ENTRESTO) 24-26 MG Take 1 tablet by mouth 2 (two) times daily. 60 tablet 11  . spironolactone (ALDACTONE) 25 MG tablet Take 1 tablet (25 mg total) by mouth daily. 90 tablet 3  . metoprolol succinate (TOPROL-XL) 50 MG 24 hr tablet Take 1.5 tablets (75 mg total) by mouth 2 (two) times daily. 270 tablet 3   No current facility-administered medications for this encounter.   BP 124/78   Pulse 71   Ht 5\' 3"  (1.6 m)   Wt 72 kg (158 lb 12.8 oz)   SpO2 97%   BMI 28.13 kg/m  General: NAD Neck: No JVD, no thyromegaly or thyroid nodule.  Lungs: Clear to auscultation bilaterally with normal respiratory effort. CV: Nondisplaced PMI.  Heart regular S1/S2, no S3/S4, no murmur.  No peripheral edema.  No carotid bruit.  Normal pedal pulses.  Abdomen: Soft, nontender, no hepatosplenomegaly, no distention.  Skin: Intact without lesions or rashes.  Neurologic: Alert and oriented x 3.  Psych: Normal affect. Extremities: No clubbing or cyanosis.  HEENT: Normal.   Assessment/Plan: 1. Chronic systolic CHF: Nonischemic cardiomyopathy.  Echo in 3/21 with EF < 20%, moderate LV dilation.  LHC/RHC in 4/21 with no CAD, preserved cardiac output.  Etiology is uncertain, she does not use ETOH/drugs, no definite family history. Possible viral myocarditis. NYHA class II symptoms; volume status looks ok.  - Increase Toprol XL to 75 mg bid.  - Continue spironolactone 25 mg daily. BMET today.    -  She was unable to tolerate increase in [place].  - Continue dapagliflozin 10 mg daily.  - I would like her to get cardiac MRI to assess for infiltrative disease/myocarditis. Apparently have had trouble getting insurance approval.  If we cannot get cMRI, will need echo to reassess EF to decided on ICD.  QRS not wide, not CRT candidate. 2. Pericardial effusion: Small-moderate pericardial effusion on 3/21 echo, no tamponade.  Will reassess on cardiac MRI versus echo.   Followup 3 months  Marca Ancona 09/06/2020

## 2020-09-06 NOTE — Patient Instructions (Addendum)
INCREASE Toprol XL 75mg  (1 1/2 tablets) twice daily  Labs done today, your results will be available in MyChart, we will contact you for abnormal readings.  Your physician recommends that you schedule a follow-up appointment in: 3 months  Your physician has requested that you have an echocardiogram. Echocardiography is a painless test that uses sound waves to create images of your heart. It provides your doctor with information about the size and shape of your heart and how well your heart's chambers and valves are working. This procedure takes approximately one hour. There are no restrictions for this procedure.  If you have any questions or concerns before your next appointment please send a message through [place] or call our office at [phone].    TO LEAVE A MESSAGE FOR THE NURSE SELECT OPTION 2, PLEASE LEAVE A MESSAGE INCLUDING: . YOUR NAME . DATE OF BIRTH . CALL BACK NUMBER . REASON FOR CALL**this is important as we prioritize the call backs  YOU WILL RECEIVE A CALL BACK THE SAME DAY AS LONG AS YOU CALL BEFORE 4:00 PM

## 2020-09-06 NOTE — Telephone Encounter (Signed)
Echocardiogram has been authorized for date of service 09/13/2020.  Authorization #- [id number] Call back # [phone]

## 2020-09-13 ENCOUNTER — Other Ambulatory Visit: Payer: Self-pay

## 2020-09-13 ENCOUNTER — Ambulatory Visit (HOSPITAL_COMMUNITY)
Admission: RE | Admit: 2020-09-13 | Discharge: 2020-09-13 | Disposition: A | Discharge status: Home / Self Care | Payer: Medicare HMO | Source: Ambulatory Visit | Attending: Family Medicine | Admitting: Family Medicine

## 2020-09-13 DIAGNOSIS — I5022 Chronic systolic (congestive) heart failure: Secondary | ICD-10-CM | POA: Diagnosis not present

## 2020-09-13 NOTE — Progress Notes (Signed)
  Echocardiogram 2D Echocardiogram has been performed.  Robin Patel G Robin Patel 09/13/2020, 4:08 PM

## 2020-09-14 LAB — ECHOCARDIOGRAM COMPLETE
Area-P 1/2: 2.99 cm2
S' Lateral: 4.1 cm

## 2020-09-20 ENCOUNTER — Other Ambulatory Visit (HOSPITAL_COMMUNITY): Payer: Self-pay | Admitting: *Deleted

## 2020-09-20 MED ORDER — POTASSIUM CHLORIDE CRYS ER 10 MEQ PO TBCR: 10.0000 meq | EXTENDED_RELEASE_TABLET | Freq: Every day | ORAL | 3 refills | Status: DC

## 2020-09-20 MED ORDER — DAPAGLIFLOZIN PROPANEDIOL 10 MG PO TABS: 10.0000 mg | ORAL_TABLET | Freq: Every day | ORAL | 11 refills | Status: DC

## 2020-09-23 ENCOUNTER — Telehealth (HOSPITAL_COMMUNITY): Payer: Self-pay

## 2020-09-23 NOTE — Telephone Encounter (Signed)
Lmtrc,letter mailed to address on file 

## 2020-09-23 NOTE — Telephone Encounter (Signed)
-----   Message from Laurey Morale, MD sent at 09/14/2020  9:13 AM EST ----- Improved EF but still low, 30-35% range. Trivial pericardial effusion.  LV function is in ICD range, would offer EP evaluation.

## 2020-10-11 ENCOUNTER — Other Ambulatory Visit (HOSPITAL_COMMUNITY): Payer: Self-pay | Admitting: *Deleted

## 2020-10-11 MED ORDER — POTASSIUM CHLORIDE CRYS ER 10 MEQ PO TBCR: 10.0000 meq | EXTENDED_RELEASE_TABLET | Freq: Every day | ORAL | 3 refills | Status: DC

## 2020-10-11 MED ORDER — FUROSEMIDE 20 MG PO TABS: 40.0000 mg | ORAL_TABLET | Freq: Every day | ORAL | 5 refills | Status: DC

## 2020-10-12 ENCOUNTER — Telehealth (HOSPITAL_COMMUNITY): Payer: Self-pay

## 2020-10-12 NOTE — Telephone Encounter (Deleted)
Advanced Heart Failure Patient Advocate Encounter   Patient was approved to receive Entresto from [employer]  Patient ID: [id number] Effective dates: 10/05/2020 through 09/02/2021  Spoke with patient regarding approval.   Virgia Land, Student-PharmD

## 2020-10-17 ENCOUNTER — Telehealth (HOSPITAL_COMMUNITY): Payer: Self-pay | Admitting: Pharmacist

## 2020-10-17 NOTE — Telephone Encounter (Signed)
Advanced Heart Failure Patient Advocate Encounter   Patient was approved to receive Entresto from [employer]  Patient ID: [id number] Effective dates: 10/05/2020 through 09/02/2021  Karle Plumber, PharmD, BCPS, BCCP, CPP Heart Failure Clinic Pharmacist [phone]

## 2020-10-17 NOTE — Telephone Encounter (Signed)
Entered in error

## 2020-10-18 ENCOUNTER — Telehealth (HOSPITAL_COMMUNITY): Payer: Self-pay | Admitting: Pharmacy Technician

## 2020-10-18 NOTE — Telephone Encounter (Signed)
Sent in [employer] application via fax. Since the patient did not receive assistance from the company last year, I had to send a physical application. I could not handle the process over the phone.  Will follow up.

## 2020-10-31 NOTE — Telephone Encounter (Signed)
Called [employer] to check the status of the patient's application. Representative stated that they needed the patient's Medicare A/B ID. Provided that information. The representative stated that they have all the necessary information to make a determination. Should have a determination by the end of today.

## 2020-11-07 ENCOUNTER — Telehealth (HOSPITAL_COMMUNITY): Payer: Self-pay | Admitting: Pharmacy Technician

## 2020-11-07 NOTE — Telephone Encounter (Addendum)
Advanced Heart Failure Patient Advocate Encounter   Patient was approved to receive Farxiga from [employer]  Patient ID: [id number] Effective dates: 11/01/20 through 09/02/21  Her shipment is in process. Could take 10-12 business days for the patient to receive. Called and updated the patient.  Archer Asa, CPhT

## 2020-12-16 ENCOUNTER — Encounter (HOSPITAL_COMMUNITY): Payer: Self-pay | Admitting: Cardiology

## 2020-12-16 ENCOUNTER — Other Ambulatory Visit: Payer: Self-pay

## 2020-12-16 ENCOUNTER — Ambulatory Visit (HOSPITAL_COMMUNITY)
Admission: RE | Admit: 2020-12-16 | Discharge: 2020-12-16 | Disposition: A | Discharge status: Home / Self Care | Payer: Medicare HMO | Source: Ambulatory Visit | Attending: Cardiology | Admitting: Cardiology

## 2020-12-16 VITALS — BP 102/60 | HR 78 | Wt 158.6 lb

## 2020-12-16 DIAGNOSIS — Z7984 Long term (current) use of oral hypoglycemic drugs: Secondary | ICD-10-CM | POA: Diagnosis not present

## 2020-12-16 DIAGNOSIS — I5022 Chronic systolic (congestive) heart failure: Secondary | ICD-10-CM | POA: Diagnosis not present

## 2020-12-16 DIAGNOSIS — I428 Other cardiomyopathies: Secondary | ICD-10-CM | POA: Insufficient documentation

## 2020-12-16 DIAGNOSIS — I313 Pericardial effusion (noninflammatory): Secondary | ICD-10-CM | POA: Insufficient documentation

## 2020-12-16 DIAGNOSIS — Z79899 Other long term (current) drug therapy: Secondary | ICD-10-CM | POA: Diagnosis not present

## 2020-12-16 LAB — BASIC METABOLIC PANEL
Anion gap: 9 (ref 5–15)
BUN: 15 mg/dL (ref 8–23)
CO2: 26 mmol/L (ref 22–32)
Calcium: 9.5 mg/dL (ref 8.9–10.3)
Chloride: 103 mmol/L (ref 98–111)
Creatinine, Ser: 0.7 mg/dL (ref 0.44–1.00)
GFR, Estimated: >60 mL/min (ref >60)
Glucose, Bld: 105 mg/dL — ABNORMAL HIGH (ref 70–99)
Potassium: 3.5 mmol/L (ref 3.5–5.1)
Sodium: 138 mmol/L (ref 135–145)

## 2020-12-16 MED ORDER — METOPROLOL SUCCINATE ER 100 MG PO TB24: 100.0000 mg | ORAL_TABLET | Freq: Two times a day (BID) | ORAL | 6 refills | Status: DC

## 2020-12-16 MED ORDER — FUROSEMIDE 20 MG PO TABS: 20.0000 mg | ORAL_TABLET | Freq: Every day | ORAL | 5 refills | Status: DC

## 2020-12-16 NOTE — Patient Instructions (Signed)
Increase Metoprolol to 100 mg Twice daily   Decrease Furosemide to 20 mg (1 tab) Daily  Your physician has requested that you have a cardiac MRI. Cardiac MRI uses a computer to create images of your heart as its beating, producing both still and moving pictures of your heart and major blood vessels. For further information please visit [email or web address]. Please follow the instruction sheet given to you today for more information. ONCE APPROVED BY YOUR INSURANCE WE WILL CALL YOU TO SCHEDULE  Your physician recommends that you schedule a follow-up appointment in: 3 months  If you have any questions or concerns before your next appointment please send [place] a message through [place] or call our office at [phone].    TO LEAVE A MESSAGE FOR THE NURSE SELECT OPTION 2, PLEASE LEAVE A MESSAGE INCLUDING: . YOUR NAME . DATE OF BIRTH . CALL BACK NUMBER . REASON FOR CALL**this is important as we prioritize the call backs  YOU WILL RECEIVE A CALL BACK THE SAME DAY AS LONG AS YOU CALL BEFORE 4:00 PM  At the [facility], you and your health needs are our priority. As part of our continuing mission to provide you with exceptional heart care, we have created designated Provider Care Teams. These Care Teams include your primary Cardiologist (physician) and Advanced Practice Providers (APPs- Physician Assistants and Nurse Practitioners) who all work together to provide you with the care you need, when you need it.   You may see any of the following providers on your designated Care Team at your next follow up: Marland Kitchen Dr Arvilla Meres . Dr Marca Ancona . Dr Thornell Mule . Tonye Becket, NP . Robbie Lis, PA . Shanda Bumps [place],NP . Karle Plumber, PharmD   Please be sure to bring in all your medications bottles to every appointment.

## 2020-12-18 NOTE — Progress Notes (Signed)
PCP: Hoy Register, MD HF Cardiology: Dr. Shirlee Latch  70 y.o. with chronic systolic CHF was referred by Dr. [name] for evaluation of CHF.  Patient had no known cardiac history prior to 3/21.  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  Several weeks prior to 3/21 admission, she began to note exertional dyspnea.  This worsened to shortness of breath with minimal exertion and severe orthopnea.  She was admitted in 3/21 with dyspnea, CXR showed pulmonary edema and echo showed EF <20% with moderate LV dilation.  Low BP limited medication titration.  RHC/LHC showed no coronary disease, preserved cardiac output.  She was discharged home.    She says that she got "hives" when Sherryll Burger was increased to 49/51 but resolved when Entresto was decreased back to 24/26 bid.   Echo in 1/22 showed EF 30-35%, global hypokinesis.   She returns for followup of CHF.  Doing well symptomatically.  Lightheaded when she stands up from bending over.  No significant exertional dyspnea.  No orthopnea/PND.  No chest pain. Weight is stable.   Labs (3/21): LDL 90 Labs (4/21): K 3.9, creatinine 0.89 => 0.94, TSH normal, BNP 355 Labs (6/21): K 3.7, creatinine 0.86 Labs (8/21): K 4.3, creatinine 0.83 Labs (10/21): K 3.6, creatinine 0.99, BNP 83 Labs (1/22): K 4.1, creatinine 0.82  PMH:  1. GERD 2. Chronic systolic CHF: Diagnosed 3/21.  Nonischemic cardiomyopathy.  - Echo (3/21): EF < 20%, moderate LV dilation, normal RV, mild-moderate pericardial effusion.  - RHC/LHC (4/21): No CAD; mean RA 6, PA 39/19, mean PCWP 17, CI 2.67 - Echo (1/22): EF 30-35%, global hypokinesis, normal RV, trivial pericardial effusion.   FH: Parents with MIs in their 32s, sister with "enlarged heart."    SH: Married with 5 kids. No ETOH, drugs.  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   ROS: All systems reviewed and negative except as per HPI.   Current Outpatient Medications   Medication Sig Dispense Refill  . dapagliflozin propanediol (FARXIGA) 10 MG TABS tablet Take 1 tablet (10 mg total) by mouth daily before breakfast. 30 tablet 11  . famotidine (PEPCID) 20 MG tablet TAKE 1 TABLET BY MOUTH TWICE A DAY 180 tablet 3  . Multiple Vitamins-Minerals (MULTIVITAMIN WITH MINERALS) tablet Take 1 tablet by mouth daily.    . pantoprazole (PROTONIX) 40 MG tablet TAKE 1 TABLET BY MOUTH EVERY DAY 90 tablet 1  . potassium chloride (KLOR-CON) 10 MEQ tablet Take 1 tablet (10 mEq total) by mouth daily. 90 tablet 3  . sacubitril-valsartan (ENTRESTO) 24-26 MG Take 1 tablet by mouth 2 (two) times daily. 60 tablet 11  . spironolactone (ALDACTONE) 25 MG tablet Take 1 tablet (25 mg total) by mouth daily. 90 tablet 3  . furosemide (LASIX) 20 MG tablet Take 1 tablet (20 mg total) by mouth daily. 30 tablet 5  . metoprolol succinate (TOPROL-XL) 100 MG 24 hr tablet Take 1 tablet (100 mg total) by mouth 2 (two) times daily. 60 tablet 6   No current facility-administered medications for this encounter.   BP 102/60   Pulse 78   Wt 71.9 kg (158 lb 9.6 oz)   SpO2 97%   BMI 28.09 kg/m  General: NAD Neck: No JVD, no thyromegaly or thyroid nodule.  Lungs: Clear to auscultation bilaterally with normal respiratory effort. CV: Nondisplaced PMI.  Heart regular S1/S2, no S3/S4, no murmur.  No peripheral edema.  No carotid bruit.  Normal pedal pulses.  Abdomen:  Soft, nontender, no hepatosplenomegaly, no distention.  Skin: Intact without lesions or rashes.  Neurologic: Alert and oriented x 3.  Psych: Normal affect. Extremities: No clubbing or cyanosis.  HEENT: Normal.   Assessment/Plan: 1. Chronic systolic CHF: Nonischemic cardiomyopathy.  Echo in 3/21 with EF < 20%, moderate LV dilation.  LHC/RHC in 4/21 with no CAD, preserved cardiac output.  Etiology is uncertain, she does not use ETOH/drugs, no definite family history. Possible viral myocarditis. Echo in 1/22 with EF higher at 30-35%.  NYHA  class II symptoms; volume status looks ok.  - Increase Toprol XL to 100 mg bid.  - Continue spironolactone 25 mg daily. BMET today.    - She was unable to tolerate increase in [place].  - Continue dapagliflozin 10 mg daily.  - Decrease Lasix to 20 mg daily.  - I would like her to get cardiac MRI to assess for infiltrative disease/myocarditis. Hopefully, EF will have improved > 35%.  If not, will need to consider ICD.  QRS not wide, not CRT candidate. 2. Pericardial effusion: Small-moderate pericardial effusion on 3/21 echo, no tamponade.  Only trivial pericardial effusion on 1/22 echo.    Followup 3 months with APP  Marca Ancona 12/18/2020

## 2021-01-12 ENCOUNTER — Other Ambulatory Visit (HOSPITAL_COMMUNITY): Payer: Self-pay | Admitting: Unknown Physician Specialty

## 2021-01-12 MED ORDER — POTASSIUM CHLORIDE CRYS ER 10 MEQ PO TBCR: 10.0000 meq | EXTENDED_RELEASE_TABLET | Freq: Every day | ORAL | 3 refills | Status: DC

## 2021-01-21 ENCOUNTER — Telehealth: Payer: Self-pay | Admitting: Student in an Organized Health Care Education/Training Program
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
## 2021-01-31 ENCOUNTER — Other Ambulatory Visit (HOSPITAL_COMMUNITY): Payer: Self-pay | Admitting: *Deleted

## 2021-01-31 MED ORDER — SPIRONOLACTONE 25 MG PO TABS: 25.0000 mg | ORAL_TABLET | Freq: Every day | ORAL | 3 refills | Status: DC

## 2021-03-21 NOTE — Progress Notes (Signed)
PCP: Hoy Register, MD HF Cardiology: Dr. Shirlee Latch  70 y.o. with chronic systolic CHF was referred by Dr. [name] for evaluation of CHF.  Patient had no known cardiac history prior to 3/21.  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  Several weeks prior to 3/21 admission, she began to note exertional dyspnea.  This worsened to shortness of breath with minimal exertion and severe orthopnea.  She was admitted in 3/21 with dyspnea, CXR showed pulmonary edema and echo showed EF <20% with moderate LV dilation.  Low BP limited medication titration.  RHC/LHC showed no coronary disease, preserved cardiac output.  She was discharged home.    She says that she got "hives" when Sherryll Burger was increased to 49/51 but resolved when Entresto was decreased back to 24/26 bid.   Echo in 1/22 showed EF 30-35%, global hypokinesis.   She returned 3/22 for followup of CHF.  Doing well symptomatically.  Lightheaded when she stands up from bending over.  Weight was stable. Her Toprol was increased, her lasix was decreased and cMRI arranged.  Today she returns for HF follow up. Overall feeling fine. Just celebrated her 70th birthday. Denies increasing SOB, CP, dizziness, edema, or PND/Orthopnea. Appetite ok. No fever or chills. Weight at home 157 pounds. Taking all medications.   Labs (3/21): LDL 90 Labs (4/21): K 3.9, creatinine 0.89 => 0.94, TSH normal, BNP 355 Labs (6/21): K 3.7, creatinine 0.86 Labs (8/21): K 4.3, creatinine 0.83 Labs (10/21): K 3.6, creatinine 0.99, BNP 83 Labs (1/22): K 4.1, creatinine 0.82 Labs (4/22): K 3.5, creatinine 0.70  PMH:  1. GERD 2. Chronic systolic CHF: Diagnosed 3/21.  Nonischemic cardiomyopathy.  - Echo (3/21): EF < 20%, moderate LV dilation, normal RV, mild-moderate pericardial effusion.  - RHC/LHC (4/21): No CAD; mean RA 6, PA 39/19, mean PCWP 17, CI 2.67 - Echo (1/22): EF 30-35%, global hypokinesis, normal RV, trivial pericardial  effusion.   FH: Parents with MIs in their 27s, sister with "enlarged heart."    SH: Married with 5 kids. No ETOH, drugs.  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   ROS: All systems reviewed and negative except as per HPI.   Current Outpatient Medications  Medication Sig Dispense Refill   dapagliflozin propanediol (FARXIGA) 10 MG TABS tablet Take 1 tablet (10 mg total) by mouth daily before breakfast. 30 tablet 11   famotidine (PEPCID) 20 MG tablet TAKE 1 TABLET BY MOUTH TWICE A DAY 180 tablet 3   furosemide (LASIX) 20 MG tablet Take 1 tablet (20 mg total) by mouth daily. 30 tablet 5   metoprolol succinate (TOPROL-XL) 100 MG 24 hr tablet Take 1 tablet (100 mg total) by mouth 2 (two) times daily. 60 tablet 6   Multiple Vitamins-Minerals (MULTIVITAMIN WITH MINERALS) tablet Take 1 tablet by mouth daily.     pantoprazole (PROTONIX) 40 MG tablet TAKE 1 TABLET BY MOUTH EVERY DAY 90 tablet 1   potassium chloride (KLOR-CON) 10 MEQ tablet Take 1 tablet (10 mEq total) by mouth daily. 90 tablet 3   sacubitril-valsartan (ENTRESTO) 24-26 MG Take 1 tablet by mouth 2 (two) times daily. 60 tablet 11   spironolactone (ALDACTONE) 25 MG tablet Take 1 tablet (25 mg total) by mouth daily. 90 tablet 3   No current facility-administered medications for this encounter.   BP 118/66   Pulse 65   Wt 71.6 kg (157 lb 12.8 oz)   SpO2 96%   BMI 27.95 kg/m  Wt Readings from Last 3 Encounters:  03/22/21 71.6 kg (157 lb 12.8 oz)  12/16/20 71.9 kg (158 lb 9.6 oz)  09/06/20 72 kg (158 lb 12.8 oz)   General:  NAD. No resp difficulty HEENT: Normal Neck: Supple. No JVD. Carotids 2+ bilat; no bruits. No lymphadenopathy or thryomegaly appreciated. Cor: PMI nondisplaced. Regular rate & rhythm. No rubs, gallops or murmurs. Lungs: Clear Abdomen: Soft, nontender, nondistended. No hepatosplenomegaly. No bruits or masses. Good bowel sounds. Extremities: No cyanosis, clubbing, rash, edema Neuro: Alert &  oriented x 3, cranial nerves grossly intact. Moves all 4 extremities w/o difficulty. Affect pleasant.  Assessment/Plan: 1. Chronic systolic CHF: Nonischemic cardiomyopathy.  Echo in 3/21 with EF < 20%, moderate LV dilation.  LHC/RHC in 4/21 with no CAD, preserved cardiac output.  Etiology is uncertain, she does not use ETOH/drugs, no definite family history. Possible viral myocarditis. Echo in 1/22 with EF higher at 30-35%.  NYHA class II symptoms; volume status looks ok.  - Continue Toprol XL 100 mg bid.  - Continue spironolactone 25 mg daily. BMET today.    - Continue Entresto 24/26 mg bid. She was unable to tolerate increase in [place].  - Continue dapagliflozin 10 mg daily.  - Continue Lasix 20 mg daily.  - I would like her to get cardiac MRI to assess for infiltrative disease/myocarditis. Hopefully, EF will have improved > 35%.  If not, will need to consider ICD.  QRS not wide, not CRT candidate. 2. Pericardial effusion: Small-moderate pericardial effusion on 3/21 echo, no tamponade.  Only trivial pericardial effusion on 1/22 echo.    Followup 3 months with Dr. Shirlee Latch. Will reach out to cMRI scheduler.  Anderson Malta [facility] FNP 03/22/2021

## 2021-03-22 ENCOUNTER — Ambulatory Visit (HOSPITAL_COMMUNITY)
Admission: RE | Admit: 2021-03-22 | Discharge: 2021-03-22 | Disposition: A | Discharge status: Home / Self Care | Payer: Medicare HMO | Source: Ambulatory Visit | Attending: Family Medicine | Admitting: Family Medicine

## 2021-03-22 ENCOUNTER — Other Ambulatory Visit: Payer: Self-pay

## 2021-03-22 ENCOUNTER — Encounter (HOSPITAL_COMMUNITY): Payer: Self-pay

## 2021-03-22 VITALS — BP 118/66 | HR 65 | Wt 157.8 lb

## 2021-03-22 DIAGNOSIS — I3139 Other pericardial effusion (noninflammatory): Secondary | ICD-10-CM

## 2021-03-22 DIAGNOSIS — I313 Pericardial effusion (noninflammatory): Secondary | ICD-10-CM

## 2021-03-22 DIAGNOSIS — I5022 Chronic systolic (congestive) heart failure: Secondary | ICD-10-CM | POA: Diagnosis not present

## 2021-03-22 DIAGNOSIS — Z7984 Long term (current) use of oral hypoglycemic drugs: Secondary | ICD-10-CM | POA: Diagnosis not present

## 2021-03-22 DIAGNOSIS — Z79899 Other long term (current) drug therapy: Secondary | ICD-10-CM | POA: Insufficient documentation

## 2021-03-22 DIAGNOSIS — I428 Other cardiomyopathies: Secondary | ICD-10-CM | POA: Diagnosis not present

## 2021-03-22 LAB — BASIC METABOLIC PANEL
Anion gap: 7 (ref 5–15)
BUN: 16 mg/dL (ref 8–23)
CO2: 23 mmol/L (ref 22–32)
Calcium: 9.4 mg/dL (ref 8.9–10.3)
Chloride: 106 mmol/L (ref 98–111)
Creatinine, Ser: 0.8 mg/dL (ref 0.44–1.00)
GFR, Estimated: >60 mL/min (ref >60)
Glucose, Bld: 96 mg/dL (ref 70–99)
Potassium: 3.9 mmol/L (ref 3.5–5.1)
Sodium: 136 mmol/L (ref 135–145)

## 2021-03-22 NOTE — Patient Instructions (Signed)
It was great to see you today! No medication changes are needed at this time.   Labs today We will only contact you if something comes back abnormal or we need to make some changes. Otherwise no news is good news!  Your physician has requested that you have a cardiac MRI. Cardiac MRI uses a computer to create images of your heart as its beating, producing both still and moving pictures of your heart and major blood vessels. For further information please visit [email or web address]. Please follow the instruction sheet given to you today for more information. -this procedure requires authorization from your insurance, once approved cardiac scheduling will be in contact to schedule  Your physician recommends that you schedule a follow-up appointment in: 3-4 months with Dr Shirlee Latch  Do the following things EVERYDAY: Weigh yourself in the morning before breakfast. Write it down and keep it in a log. Take your medicines as prescribed Eat low salt foods--Limit salt (sodium) to 2000 mg per day.  Stay as active as you can everyday Limit all fluids for the day to less than 2 liters   milAt the [facility], you and your health needs are our priority. As part of our continuing mission to provide you with exceptional heart care, we have created designated Provider Care Teams. These Care Teams include your primary Cardiologist (physician) and Advanced Practice Providers (APPs- Physician Assistants and Nurse Practitioners) who all work together to provide you with the care you need, when you need it.   You may see any of the following providers on your designated Care Team at your next follow up: Dr Arvilla Meres Dr Marca Ancona Dr Brandon Melnick, NP Robbie Lis, [place] Mikki Santee Karle Plumber, PharmD   Please be sure to bring in all your medications bottles to every appointment.

## 2021-03-23 ENCOUNTER — Encounter (HOSPITAL_COMMUNITY): Payer: Medicare HMO

## 2021-04-13 ENCOUNTER — Telehealth (HOSPITAL_COMMUNITY): Payer: Self-pay | Admitting: *Deleted

## 2021-04-13 NOTE — Telephone Encounter (Signed)
CM[employer] auth in clinical review  Procedure Tracking #: [id number]

## 2021-04-13 NOTE — Telephone Encounter (Signed)
Original auth expired. Procedure never scheduled.

## 2021-04-17 ENCOUNTER — Telehealth (HOSPITAL_COMMUNITY): Payer: Self-pay | Admitting: *Deleted

## 2021-04-17 NOTE — Telephone Encounter (Signed)
[employer] Berkley Harvey #[id number] exp. 05/19/21   Message sent to Robin Patel to schedule.

## 2021-05-16 ENCOUNTER — Other Ambulatory Visit (HOSPITAL_COMMUNITY): Payer: Self-pay | Admitting: *Deleted

## 2021-05-16 ENCOUNTER — Telehealth (HOSPITAL_COMMUNITY): Payer: Self-pay | Admitting: *Deleted

## 2021-05-16 DIAGNOSIS — Z01812 Encounter for preprocedural laboratory examination: Secondary | ICD-10-CM

## 2021-05-16 NOTE — Telephone Encounter (Signed)
Reaching out to patient to offer assistance regarding upcoming cardiac imaging study; pt verbalizes understanding of appt date/time, parking situation and where to check in, and is aware to get blood work prior to appointment; name and call back number provided for further questions should they arise  Larey Brick RN Navigator Cardiac Imaging [facility] [phone] office [phone] cell

## 2021-05-18 ENCOUNTER — Other Ambulatory Visit: Payer: Self-pay

## 2021-05-18 ENCOUNTER — Ambulatory Visit (HOSPITAL_COMMUNITY)
Admission: RE | Admit: 2021-05-18 | Discharge: 2021-05-18 | Disposition: A | Discharge status: Home / Self Care | Payer: Medicare HMO | Source: Ambulatory Visit | Attending: Cardiology | Admitting: Cardiology

## 2021-05-18 ENCOUNTER — Other Ambulatory Visit (HOSPITAL_COMMUNITY): Payer: Self-pay | Admitting: *Deleted

## 2021-05-18 ENCOUNTER — Other Ambulatory Visit (HOSPITAL_COMMUNITY): Payer: Self-pay | Admitting: Cardiology

## 2021-05-18 DIAGNOSIS — I5022 Chronic systolic (congestive) heart failure: Secondary | ICD-10-CM | POA: Insufficient documentation

## 2021-05-18 DIAGNOSIS — Z01812 Encounter for preprocedural laboratory examination: Secondary | ICD-10-CM

## 2021-05-18 DIAGNOSIS — I429 Cardiomyopathy, unspecified: Secondary | ICD-10-CM | POA: Diagnosis not present

## 2021-05-18 LAB — CBC
HCT: 41.6 % (ref 36.0–46.0)
Hemoglobin: 13.4 g/dL (ref 12.0–15.0)
MCH: 30.2 pg (ref 26.0–34.0)
MCHC: 32.2 g/dL (ref 30.0–36.0)
MCV: 93.9 fL (ref 80.0–100.0)
Platelets: 291 10*3/uL (ref 150–400)
RBC: 4.43 MIL/uL (ref 3.87–5.11)
RDW: 13.9 % (ref 11.5–15.5)
WBC: 4.7 10*3/uL (ref 4.0–10.5)
nRBC: 0 % (ref 0.0–0.2)

## 2021-05-19 ENCOUNTER — Ambulatory Visit (HOSPITAL_COMMUNITY)
Admission: RE | Admit: 2021-05-19 | Discharge: 2021-05-19 | Disposition: A | Discharge status: Home / Self Care | Payer: Medicare HMO | Source: Ambulatory Visit | Attending: Cardiology | Admitting: Cardiology

## 2021-05-19 DIAGNOSIS — I5022 Chronic systolic (congestive) heart failure: Secondary | ICD-10-CM

## 2021-05-19 DIAGNOSIS — I429 Cardiomyopathy, unspecified: Secondary | ICD-10-CM | POA: Diagnosis not present

## 2021-05-19 MED ORDER — GADOBUTROL 1 MMOL/ML IV SOLN
7.0000 mL | Freq: Once | INTRAVENOUS | Status: AC | PRN
  Administered 2021-05-19: 7 mL via INTRAVENOUS

## 2021-05-25 ENCOUNTER — Telehealth (HOSPITAL_COMMUNITY): Payer: Self-pay | Admitting: Emergency Medicine

## 2021-06-16 ENCOUNTER — Other Ambulatory Visit (HOSPITAL_COMMUNITY): Payer: Self-pay | Admitting: *Deleted

## 2021-06-23 ENCOUNTER — Ambulatory Visit (HOSPITAL_COMMUNITY)
Admission: RE | Admit: 2021-06-23 | Discharge: 2021-06-23 | Disposition: A | Discharge status: Home / Self Care | Payer: Medicare HMO | Source: Ambulatory Visit | Attending: Cardiology | Admitting: Cardiology

## 2021-06-23 ENCOUNTER — Encounter (HOSPITAL_COMMUNITY): Payer: Self-pay | Admitting: Cardiology

## 2021-06-23 ENCOUNTER — Other Ambulatory Visit (HOSPITAL_COMMUNITY): Payer: Self-pay | Admitting: Cardiology

## 2021-06-23 VITALS — BP 110/70 | HR 69 | Wt 165.0 lb

## 2021-06-23 DIAGNOSIS — I3139 Other pericardial effusion (noninflammatory): Secondary | ICD-10-CM | POA: Insufficient documentation

## 2021-06-23 DIAGNOSIS — I428 Other cardiomyopathies: Secondary | ICD-10-CM | POA: Insufficient documentation

## 2021-06-23 DIAGNOSIS — Z7984 Long term (current) use of oral hypoglycemic drugs: Secondary | ICD-10-CM | POA: Insufficient documentation

## 2021-06-23 DIAGNOSIS — I5022 Chronic systolic (congestive) heart failure: Secondary | ICD-10-CM | POA: Diagnosis not present

## 2021-06-23 LAB — BASIC METABOLIC PANEL
Anion gap: 8 (ref 5–15)
BUN: 17 mg/dL (ref 8–23)
CO2: 23 mmol/L (ref 22–32)
Calcium: 8.9 mg/dL (ref 8.9–10.3)
Chloride: 106 mmol/L (ref 98–111)
Creatinine, Ser: 0.66 mg/dL (ref 0.44–1.00)
GFR, Estimated: >60 mL/min (ref >60)
Glucose, Bld: 82 mg/dL (ref 70–99)
Potassium: 3.8 mmol/L (ref 3.5–5.1)
Sodium: 137 mmol/L (ref 135–145)

## 2021-06-23 NOTE — Progress Notes (Signed)
Medication Samples have been provided to the patient.  Drug name: Sherryll Burger       Strength: 24-26mg         Qty: 1  LOT: [id number]  Exp.Date: 06/2023  Dosing instructions: 1 tab po bid  The patient has been instructed regarding the correct time, dose, and frequency of taking this medication, including desired effects and most common side effects.   Norva Bowe R Diona Peregoy 3:17 PM 06/23/2021

## 2021-06-23 NOTE — Patient Instructions (Signed)
EKG done today.  Labs done today. We will contact you only if your labs are abnormal.  1 week of Entresto samples were provided to you during your appointment today.   No other medication changes were made. Please continue all current medications as prescribed.  Your physician recommends that you schedule a follow-up appointment in: 3 months for a lab only appointment and in 6 months with Dr. Shirlee Latch. Please contact our office in March 2023 to schedule a April 2023 appointment.   If you have any questions or concerns before your next appointment please send [place] a message through [place] or call our office at [phone].    TO LEAVE A MESSAGE FOR THE NURSE SELECT OPTION 2, PLEASE LEAVE A MESSAGE INCLUDING: YOUR NAME DATE OF BIRTH CALL BACK NUMBER REASON FOR CALL**this is important as we prioritize the call backs  YOU WILL RECEIVE A CALL BACK THE SAME DAY AS LONG AS YOU CALL BEFORE 4:00 PM   Do the following things EVERYDAY: Weigh yourself in the morning before breakfast. Write it down and keep it in a log. Take your medicines as prescribed Eat low salt foods--Limit salt (sodium) to 2000 mg per day.  Stay as active as you can everyday Limit all fluids for the day to less than 2 liters   At the [facility], you and your health needs are our priority. As part of our continuing mission to provide you with exceptional heart care, we have created designated Provider Care Teams. These Care Teams include your primary Cardiologist (physician) and Advanced Practice Providers (APPs- Physician Assistants and Nurse Practitioners) who all work together to provide you with the care you need, when you need it.   You may see any of the following providers on your designated Care Team at your next follow up: Dr Arvilla Meres Dr Carron Curie, NP Robbie Lis, [place] Karle Plumber, PharmD   Please be sure to bring in all your medications bottles to every appointment.

## 2021-06-25 NOTE — Progress Notes (Signed)
PCP: Hoy Register, MD HF Cardiology: Dr. Shirlee Latch  70 y.o. with chronic systolic CHF was referred by Dr. [name] for evaluation of CHF.  Patient had no known cardiac history prior to 3/21.  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  Several weeks prior to 3/21 admission, she began to note exertional dyspnea.  This worsened to shortness of breath with minimal exertion and severe orthopnea.  She was admitted in 3/21 with dyspnea, CXR showed pulmonary edema and echo showed EF <20% with moderate LV dilation.  Low BP limited medication titration.  RHC/LHC showed no coronary disease, preserved cardiac output.  She was discharged home.    She says that she got "hives" when Sherryll Burger was increased to 49/51 but resolved when Entresto was decreased back to 24/26 bid.   Echo in 1/22 showed EF 30-35%, global hypokinesis. Cardiac MRI in 9/22 showed EF 54%, normal RV, no delayed enhancement.   She returns for followup of CHF.  No significant exertional dyspnea.  No orthopnea/PND.  Rare lightheadedness with standing, no falls.  No chest pain.  Overall, doing well.    Labs (3/21): LDL 90 Labs (4/21): K 3.9, creatinine 0.89 => 0.94, TSH normal, BNP 355 Labs (6/21): K 3.7, creatinine 0.86 Labs (8/21): K 4.3, creatinine 0.83 Labs (10/21): K 3.6, creatinine 0.99, BNP 83 Labs (1/22): K 4.1, creatinine 0.82 Labs (7/22): K 3.9, creatinine 0.8 Labs (9/22): hgb 13.4  ECG (personally reviewed): NSR, septal Qs  PMH:  1. GERD 2. Chronic systolic CHF: Diagnosed 3/21.  Nonischemic cardiomyopathy.  - Echo (3/21): EF < 20%, moderate LV dilation, normal RV, mild-moderate pericardial effusion.  - RHC/LHC (4/21): No CAD; mean RA 6, PA 39/19, mean PCWP 17, CI 2.67 - Echo (1/22): EF 30-35%, global hypokinesis, normal RV, trivial pericardial effusion.  - Echo (9/22): EF 54%, normal RV, no delayed enhancement.  FH: Parents with MIs in their 48s, sister with "enlarged heart."     SH: Married with 5 kids. No ETOH, drugs.  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   ROS: All systems reviewed and negative except as per HPI.   Current Outpatient Medications  Medication Sig Dispense Refill   dapagliflozin propanediol (FARXIGA) 10 MG TABS tablet Take 1 tablet (10 mg total) by mouth daily before breakfast. 30 tablet 11   famotidine (PEPCID) 20 MG tablet TAKE 1 TABLET BY MOUTH TWICE A DAY 180 tablet 3   furosemide (LASIX) 20 MG tablet Take 1 tablet (20 mg total) by mouth daily. 30 tablet 5   Multiple Vitamins-Minerals (MULTIVITAMIN WITH MINERALS) tablet Take 1 tablet by mouth daily.     pantoprazole (PROTONIX) 40 MG tablet TAKE 1 TABLET BY MOUTH EVERY DAY 90 tablet 1   potassium chloride (KLOR-CON) 10 MEQ tablet Take 1 tablet (10 mEq total) by mouth daily. 90 tablet 3   sacubitril-valsartan (ENTRESTO) 24-26 MG Take 1 tablet by mouth 2 (two) times daily. 60 tablet 11   spironolactone (ALDACTONE) 25 MG tablet Take 1 tablet (25 mg total) by mouth daily. 90 tablet 3   metoprolol succinate (TOPROL-XL) 100 MG 24 hr tablet TAKE 1 TABLET BY MOUTH TWICE A DAY 180 tablet 2   No current facility-administered medications for this encounter.   BP 110/70   Pulse 69   Wt 74.8 kg (165 lb)   SpO2 97%   BMI 29.23 kg/m  General: NAD Neck: No JVD, no thyromegaly or thyroid nodule.  Lungs: Clear to auscultation bilaterally  with normal respiratory effort. CV: Nondisplaced PMI.  Heart regular S1/S2, no S3/S4, no murmur.  No peripheral edema.  No carotid bruit.  Normal pedal pulses.  Abdomen: Soft, nontender, no hepatosplenomegaly, no distention.  Skin: Intact without lesions or rashes.  Neurologic: Alert and oriented x 3.  Psych: Normal affect. Extremities: No clubbing or cyanosis.  HEENT: Normal.   Assessment/Plan: 1. Chronic systolic CHF: Nonischemic cardiomyopathy.  Echo in 3/21 with EF < 20%, moderate LV dilation.  LHC/RHC in 4/21 with no CAD, preserved cardiac  output.  Etiology is uncertain, she does not use ETOH/drugs, no definite family history. Possible viral myocarditis. Echo in 1/22 with EF higher at 30-35%.  Cardiac MRI in 9/22 showed EF up to 54%, no delayed enhancement.  NYHA class II symptoms; volume status looks ok.  - Continue Toprol XL 100 mg bid.  - Continue spironolactone 25 mg daily. BMET today.    - She was unable to tolerate increase in [place], continue 24/26 bid.  - Continue dapagliflozin 10 mg daily.  - Continue Lasix 20 mg daily.  - EF now out of range for ICD.  2. Pericardial effusion: Small-moderate pericardial effusion on 3/21 echo, no tamponade.  Only trivial pericardial effusion on 1/22 echo.    Followup 6 months with APP, needs BMET in 3 months.   Marca Ancona 06/25/2021

## 2021-07-23 ENCOUNTER — Other Ambulatory Visit (HOSPITAL_COMMUNITY): Payer: Self-pay | Admitting: Cardiology

## 2021-08-07 ENCOUNTER — Other Ambulatory Visit (HOSPITAL_COMMUNITY): Payer: Self-pay | Admitting: Cardiology

## 2021-08-16 ENCOUNTER — Other Ambulatory Visit (HOSPITAL_COMMUNITY): Payer: Self-pay

## 2021-08-16 MED ORDER — ENTRESTO 24-26 MG PO TABS: 1.0000 | ORAL_TABLET | Freq: Two times a day (BID) | ORAL | 0 refills | Status: DC

## 2021-09-25 ENCOUNTER — Ambulatory Visit (HOSPITAL_COMMUNITY)
Admission: RE | Admit: 2021-09-25 | Discharge: 2021-09-25 | Disposition: A | Discharge status: Home / Self Care | Payer: Medicare HMO | Source: Ambulatory Visit | Attending: Cardiology | Admitting: Cardiology

## 2021-09-25 ENCOUNTER — Telehealth (HOSPITAL_COMMUNITY): Payer: Self-pay | Admitting: Pharmacy Technician

## 2021-09-25 ENCOUNTER — Other Ambulatory Visit: Payer: Self-pay

## 2021-09-25 ENCOUNTER — Other Ambulatory Visit (HOSPITAL_COMMUNITY): Payer: Self-pay

## 2021-09-25 DIAGNOSIS — I5022 Chronic systolic (congestive) heart failure: Secondary | ICD-10-CM | POA: Diagnosis not present

## 2021-09-25 LAB — BASIC METABOLIC PANEL
Anion gap: 8 (ref 5–15)
BUN: 17 mg/dL (ref 8–23)
CO2: 23 mmol/L (ref 22–32)
Calcium: 9 mg/dL (ref 8.9–10.3)
Chloride: 109 mmol/L (ref 98–111)
Creatinine, Ser: 0.77 mg/dL (ref 0.44–1.00)
GFR, Estimated: >60 mL/min (ref >60)
Glucose, Bld: 99 mg/dL (ref 70–99)
Potassium: 3.8 mmol/L (ref 3.5–5.1)
Sodium: 140 mmol/L (ref 135–145)

## 2021-09-25 NOTE — Telephone Encounter (Signed)
Advanced Heart Failure Patient Advocate Encounter  Received a notice from [employer] that it is time to renew Robin Patel assistance. Was going to get patient to sign application at her lab visit, the patient did not show.  Let voicemail for patient to call back and start the re-enrollment process.   [name], CPhT

## 2021-10-05 ENCOUNTER — Telehealth (HOSPITAL_COMMUNITY): Payer: Self-pay | Admitting: *Deleted

## 2021-10-05 NOTE — Telephone Encounter (Signed)
Pt called stating her appt says she is scheduled with Dr.Bensimhon in April but she is a Dr.McLean pt. Appointment was scheduled by [facility]. Per Meredith Staggers, RN msg routed to [facility] to r/s.

## 2021-10-06 NOTE — Telephone Encounter (Signed)
Advanced Heart Failure Patient Advocate Encounter  The patient was approved for a PAN HF grant that will help cover the cost of Entresto.  Member ID: [id number] Group ID: [id number] RxBin ID: [id number] PCN: [id number] Eligibility Start Date: 07/08/2021 Eligibility End Date: 07/07/2022 Assistance Amount: $1,200.00

## 2021-10-13 ENCOUNTER — Telehealth (HOSPITAL_COMMUNITY): Payer: Self-pay | Admitting: Cardiology

## 2021-11-10 ENCOUNTER — Other Ambulatory Visit (HOSPITAL_COMMUNITY): Payer: Self-pay | Admitting: *Deleted

## 2021-11-10 MED ORDER — DAPAGLIFLOZIN PROPANEDIOL 10 MG PO TABS: 10.0000 mg | ORAL_TABLET | Freq: Every day | ORAL | 11 refills | Status: DC

## 2021-11-17 ENCOUNTER — Other Ambulatory Visit (HOSPITAL_COMMUNITY): Payer: Self-pay | Admitting: *Deleted

## 2021-11-17 MED ORDER — ENTRESTO 24-26 MG PO TABS: 1.0000 | ORAL_TABLET | Freq: Two times a day (BID) | ORAL | 3 refills | Status: DC

## 2021-11-17 NOTE — Telephone Encounter (Signed)
Advanced Heart Failure Patient Advocate Encounter ? ?Called and left the patient a message regarding update of Entresto. Sent 90 day RX request to [facility] ([facility] ? ?Also, patient stated [facility] has not received rx that was previously sent electronically. I called [facility] and provided a verbal rx.  ? ?Archer Asa, CPhT ?  ?  ?

## 2021-11-21 ENCOUNTER — Other Ambulatory Visit (HOSPITAL_COMMUNITY): Payer: Self-pay | Admitting: *Deleted

## 2021-11-25 IMAGING — MR MR CARD MORPHOLOGY WO/W CM
45 of 48 series · 45 of 48 positions shown · IV contrast (Contrast agent)
Comparison: none

CLINICAL DATA: Cardiomyopathy of uncertain etiology

EXAM:
CARDIAC MRI
TECHNIQUE: The patient was scanned on a 1.5 Tesla GE magnet. A dedicated
cardiac coil was used. Functional imaging was done using Fiesta
sequences. [DATE], and 4 chamber views were done to assess for RWMA's.
Modified Poupart rule using a short axis stack was used to
calculate an ejection fraction on a dedicated work station using
Circle software. The patient received 8 cc of Gadavist. After 10
minutes inversion recovery sequences were used to assess for
infiltration and scar tissue.

[Series 4: [id number] · axial · 8.0mm · 1.41mm/px · 1 of 16 slices shown]
[im 1/16]
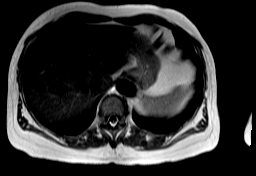

[Series 8: bSSFP · oblique · 8.0mm · 1.61mm/px · 1 of 25 slices shown (1 of 17)]
[im 1/25]
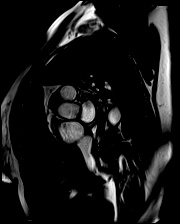

[Series 9: bSSFP · oblique · 8.0mm · 1.61mm/px · 1 of 25 slices shown (2 of 17)]
[im 1/25]
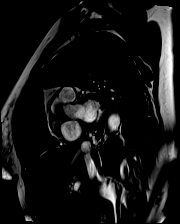

[Series 10: bSSFP · oblique · 8.0mm · 1.61mm/px · 1 of 25 slices shown (3 of 17)]
[im 1/25]
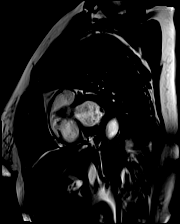

[Series 11: bSSFP · oblique · 8.0mm · 1.61mm/px · 1 of 25 slices shown (4 of 17)]
[im 1/25]
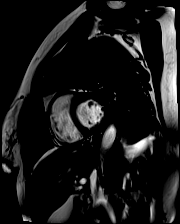

[Series 12: bSSFP · oblique · 8.0mm · 1.61mm/px · 1 of 25 slices shown (5 of 17)]
[im 1/25]
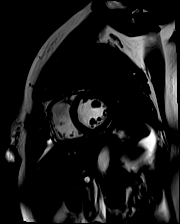

[Series 13: bSSFP · oblique · 8.0mm · 1.61mm/px · 1 of 25 slices shown (6 of 17)]
[im 1/25]
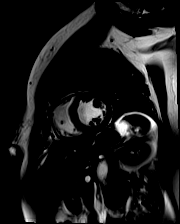

[Series 14: bSSFP · oblique · 8.0mm · 1.61mm/px · 1 of 25 slices shown (7 of 17)]
[im 1/25]
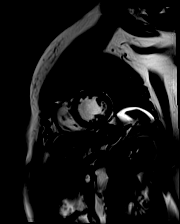

[Series 15: bSSFP · oblique · 8.0mm · 1.61mm/px · 1 of 25 slices shown (8 of 17)]
[im 1/25]
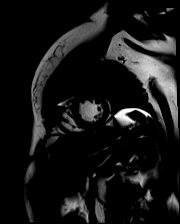

[Series 16: bSSFP · oblique · 8.0mm · 1.61mm/px · 1 of 25 slices shown (9 of 17)]
[im 1/25]
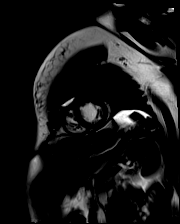

[Series 17: bSSFP · oblique · 8.0mm · 1.61mm/px · 1 of 25 slices shown (10 of 17)]
[im 1/25]
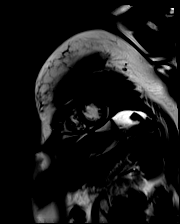

[Series 18: bSSFP · oblique · 8.0mm · 1.61mm/px · 1 of 25 slices shown (11 of 17)]
[im 1/25]
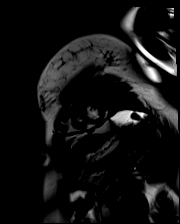

[Series 19: bSSFP · oblique · 8.0mm · 1.61mm/px · 1 of 25 slices shown (12 of 17)]
[im 1/25]
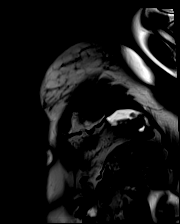

[Series 20: bSSFP · oblique · 8.0mm · 1.61mm/px · 1 of 25 slices shown (13 of 17)]
[im 1/25]
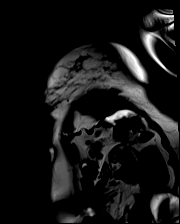

[Series 21: bSSFP · oblique · 6.0mm · 1.41mm/px · 1 of 25 slices shown (14 of 17)]
[im 1/25]
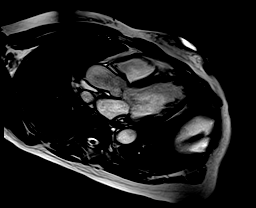

[Series 22: bSSFP · oblique · 6.0mm · 1.41mm/px · 1 of 25 slices shown (15 of 17)]
[im 1/25]
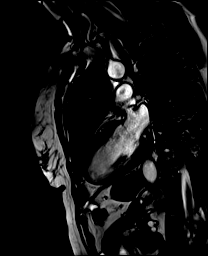

[Series 23: bSSFP · axial · 6.0mm · 1.41mm/px · 1 of 25 slices shown (16 of 17)]
[im 1/25]
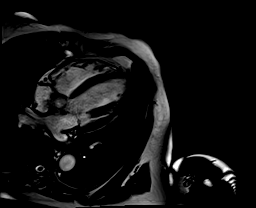

[Series 24: STIR · oblique · 8.0mm · 1.92mm/px · 1 of 17 slices shown]
[im 1/17]
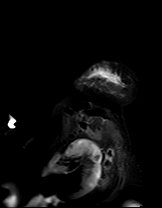

[Series 25: (id)_long_t1 · oblique · 8.0mm · 1.56mm/px · 1 of 24 slices shown]
[im 1/24]
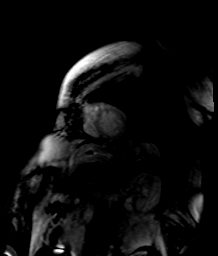

[Series 26: (id)_long_t1_moco · oblique · 8.0mm · 1.56mm/px · 1 of 24 slices shown]
[im 1/24]
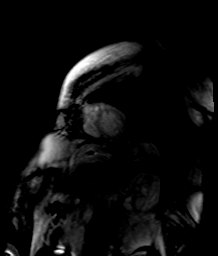

[Series 27: (id)_long_t1_moco_t1 · oblique · 8.0mm · 1.56mm/px · 1 of 6 slices shown]
[im 1/6]
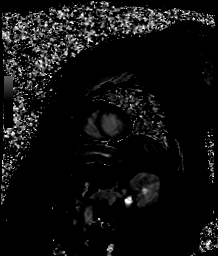

[Series 29: (id)_trufi · oblique · 8.0mm · 2.08mm/px · 1 of 9 slices shown]
[im 1/9]
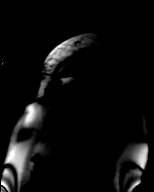

[Series 30: (id)_trufi_moco · oblique · 8.0mm · 2.08mm/px · 1 of 9 slices shown]
[im 1/9]
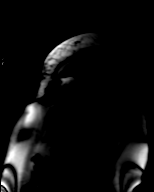

[Series 31: (id)_trufi_moco_t2 · oblique · 8.0mm · 2.08mm/px · 1 of 3 slices shown]
[im 1/3]
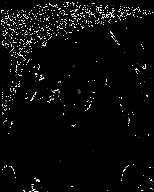

[Series 33: pre short axis · oblique · non-contrast · 8.0mm · 2.25mm/px · 1 of 10 slices shown (1 of 6)]
[im 1/10]
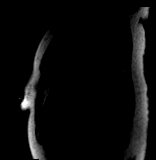

[Series 34: pre short axis · oblique · non-contrast · 8.0mm · 2.25mm/px · 1 of 10 slices shown (2 of 6)]
[im 1/10]
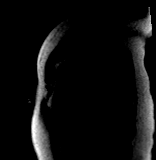

[Series 35: pre short axis · oblique · non-contrast · 8.0mm · 2.25mm/px · 1 of 10 slices shown (3 of 6)]
[im 1/10]
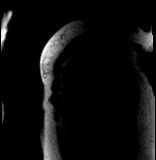

[Series 36: pre short axis · oblique · non-contrast · 8.0mm · 2.25mm/px · 1 of 10 slices shown (4 of 6)]
[im 1/10]
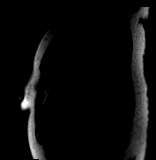

[Series 37: pre short axis · oblique · non-contrast · 8.0mm · 2.25mm/px · 1 of 10 slices shown (5 of 6)]
[im 1/10]
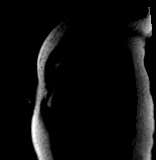

[Series 38: pre short axis · oblique · non-contrast · 8.0mm · 2.25mm/px · 1 of 10 slices shown (6 of 6)]
[im 1/10]
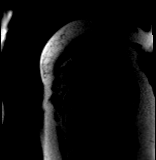

[Series 39: rest short axis · oblique · 8.0mm · 2.25mm/px · 1 of 60 slices shown (1 of 6)]
[im 1/60]
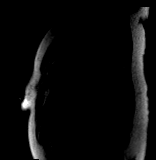

[Series 40: rest short axis · oblique · 8.0mm · 2.25mm/px · 1 of 60 slices shown (2 of 6)]
[im 1/60]
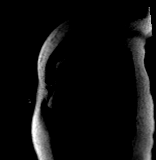

[Series 41: rest short axis · oblique · 8.0mm · 2.25mm/px · 1 of 60 slices shown (3 of 6)]
[im 1/60]
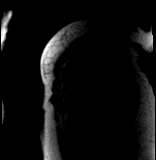

[Series 42: rest short axis · oblique · 8.0mm · 2.25mm/px · 1 of 60 slices shown (4 of 6)]
[im 1/60]
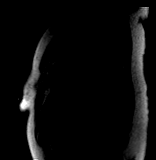

[Series 43: rest short axis · oblique · 8.0mm · 2.25mm/px · 1 of 60 slices shown (5 of 6)]
[im 1/60]
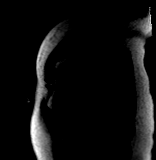

[Series 44: rest short axis · oblique · 8.0mm · 2.25mm/px · 1 of 60 slices shown (6 of 6)]
[im 1/60]
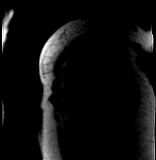

[Series 45: bSSFP · coronal · 6.0mm · 1.41mm/px · 1 of 25 slices shown (17 of 17)]
[im 1/25]
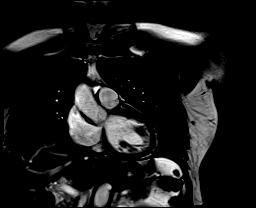

[Series 46: aortic valve cine · oblique · 6.0mm · 1.41mm/px · 1 of 25 slices shown]
[im 1/25]
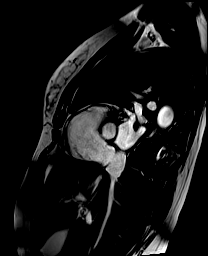

[Series 47: cine rvit · oblique · 6.0mm · 1.41mm/px · 1 of 25 slices shown]
[im 1/25]
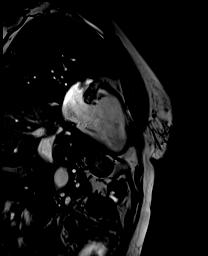

[Series 48: cine rvot · sagittal · 6.0mm · 1.41mm/px · 1 of 25 slices shown]
[im 1/25]
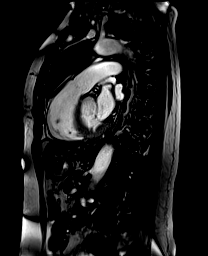

[Series 50: lge_single shot sa · oblique · 8.0mm · 2.08mm/px · 1 of 17 slices shown (1 of 2)]
[im 1/17]
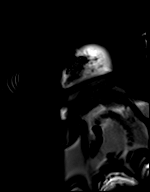

[Series 51: lge_single shot sa · oblique · 8.0mm · 2.08mm/px · 1 of 17 slices shown (2 of 2)]
[im 1/17]
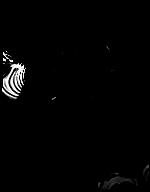

[Series 54: lge_single shot 4 · axial · 6.0mm · 1.98mm/px · 1 of 1 slices shown (1 of 2)]
[im 1/1]
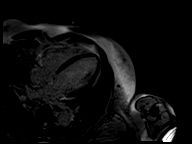

[Series 55: lge_single shot 4 · axial · 6.0mm · 1.98mm/px · 1 of 1 slices shown (2 of 2)]
[im 1/1]
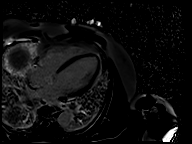

[Series 56: lge_single shot 3 · axial · 6.0mm · 1.98mm/px · 1 of 1 slices shown]
[im 1/1]
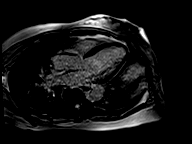

[45 of 48 positions shown; findings below may reference images not displayed]

FINDINGS: Limited images of the lung fields show no gross abnormalities.

Normal left ventricular size with normal LV thickness. Normal wall
motion with mildly decreased, 54%. Normal right ventricular size and
systolic function, EF 58%. Normal right and left atrial sizes. No
significant mitral regurgitation noted. Trileaflet aortic valve with
no significant regurgitation or stenosis noted.

On delayed enhancement imaging, there was no significant myocardial
late gadolinium enhancement (LGE).

MEASUREMENTS:
MEASUREMENTS
LVEDV 130 mL
LVSV 70 mL
LVEF 54%

RVEDV 128 mL
RVSV 75 mL
RVEF 58%

ECV 27% in septum
IMPRESSION: 1.  Normal LV size with mildly decreased systolic function, EF 54%.

2.  Normal RV size and systolic function.

3.  On delayed enhancement imaging, there was no significant LGE.

4.  Normal extracellular fluid volume percentage on T1 imaging.

Marck Antoni Borrero

## 2021-12-19 ENCOUNTER — Telehealth (HOSPITAL_COMMUNITY): Payer: Self-pay | Admitting: *Deleted

## 2021-12-19 NOTE — Telephone Encounter (Signed)
Pt left vm stating she is out of entresto and she gets it through [facility]. Pt asked if we had samples. I called pt back no answer and vm full. ?

## 2021-12-25 ENCOUNTER — Other Ambulatory Visit (HOSPITAL_COMMUNITY): Payer: Self-pay | Admitting: Cardiology

## 2021-12-25 ENCOUNTER — Other Ambulatory Visit (HOSPITAL_COMMUNITY): Payer: Self-pay | Admitting: *Deleted

## 2021-12-27 ENCOUNTER — Other Ambulatory Visit (HOSPITAL_COMMUNITY): Payer: Self-pay | Admitting: Cardiology

## 2021-12-28 ENCOUNTER — Encounter (HOSPITAL_COMMUNITY): Payer: Medicare HMO | Admitting: Internal Medicine

## 2021-12-28 ENCOUNTER — Encounter (HOSPITAL_COMMUNITY): Payer: Medicare HMO | Admitting: Cardiology

## 2022-01-04 ENCOUNTER — Ambulatory Visit (HOSPITAL_COMMUNITY)
Admission: RE | Admit: 2022-01-04 | Discharge: 2022-01-04 | Disposition: A | Discharge status: Home / Self Care | Payer: Medicare HMO | Source: Ambulatory Visit | Attending: Cardiology | Admitting: Cardiology

## 2022-01-04 ENCOUNTER — Encounter (HOSPITAL_COMMUNITY): Payer: Self-pay | Admitting: Cardiology

## 2022-01-04 VITALS — Wt 166.0 lb

## 2022-01-04 DIAGNOSIS — I3139 Other pericardial effusion (noninflammatory): Secondary | ICD-10-CM | POA: Diagnosis not present

## 2022-01-04 DIAGNOSIS — I428 Other cardiomyopathies: Secondary | ICD-10-CM | POA: Insufficient documentation

## 2022-01-04 DIAGNOSIS — Z79899 Other long term (current) drug therapy: Secondary | ICD-10-CM | POA: Diagnosis not present

## 2022-01-04 DIAGNOSIS — I5022 Chronic systolic (congestive) heart failure: Secondary | ICD-10-CM | POA: Insufficient documentation

## 2022-01-04 LAB — BASIC METABOLIC PANEL
Anion gap: 5 (ref 5–15)
BUN: 14 mg/dL (ref 8–23)
CO2: 24 mmol/L (ref 22–32)
Calcium: 8.9 mg/dL (ref 8.9–10.3)
Chloride: 109 mmol/L (ref 98–111)
Creatinine, Ser: 0.73 mg/dL (ref 0.44–1.00)
GFR, Estimated: >60 mL/min (ref >60)
Glucose, Bld: 88 mg/dL (ref 70–99)
Potassium: 3.6 mmol/L (ref 3.5–5.1)
Sodium: 138 mmol/L (ref 135–145)

## 2022-01-04 LAB — BRAIN NATRIURETIC PEPTIDE: B Natriuretic Peptide: 15 pg/mL (ref 0.0–100.0)

## 2022-01-04 MED ORDER — DAPAGLIFLOZIN PROPANEDIOL 10 MG PO TABS: 10.0000 mg | ORAL_TABLET | Freq: Every day | ORAL | 30 refills | Status: DC

## 2022-01-04 NOTE — Progress Notes (Signed)
Medication Samples have been provided to the patient. ? ?Drug name: Paul Dykes       Strength: 10mg         Qty: 4 boxes  LOT:  Exp.Date: 09/25 ? ?Dosing instructions: take 1 tablet daily  ? ?The patient has been instructed regarding the correct time, dose, and frequency of taking this medication, including desired effects and most common side effects.  ? ?Alexandros Ewan M Kelsy Polack ?2:43 PM ?01/04/2022 ? ?

## 2022-01-04 NOTE — Patient Instructions (Signed)
There has been no changes to your medications  ? ?Labs done today, your results will be available in MyChart, we will contact you for abnormal readings. ? ?Your physician has requested that you have an echocardiogram. Echocardiography is a painless test that uses sound waves to create images of your heart. It provides your doctor with information about the size and shape of your heart and how well your heart?s chambers and valves are working. This procedure takes approximately one hour. There are no restrictions for this procedure. ? ?Your physician recommends that you schedule a follow-up appointment in: 6 months (November 2023)  **please call the office in September to arrange your follow up appointment ** ? ?If you have any questions or concerns before your next appointment please send [place] a message through [place] or call our office at [phone].   ? ?TO LEAVE A MESSAGE FOR THE NURSE SELECT OPTION 2, PLEASE LEAVE A MESSAGE INCLUDING: ?YOUR NAME ?DATE OF BIRTH ?CALL BACK NUMBER ?REASON FOR CALL**this is important as we prioritize the call backs ? ?YOU WILL RECEIVE A CALL BACK THE SAME DAY AS LONG AS YOU CALL BEFORE 4:00 PM ? ?At the [facility], you and your health needs are our priority. As part of our continuing mission to provide you with exceptional heart care, we have created designated Provider Care Teams. These Care Teams include your primary Cardiologist (physician) and Advanced Practice Providers (APPs- Physician Assistants and Nurse Practitioners) who all work together to provide you with the care you need, when you need it.  ? ?You may see any of the following providers on your designated Care Team at your next follow up: ?Dr Glori Bickers ?Dr Loralie Champagne ?Darrick Grinder, NP ?Lyda Jester, PA ?Jessica Milford,NP ?Marlyce Huge, PA ?Audry Riles, PharmD ? ? ?Please be sure to bring in all your medications bottles to every appointment.  ? ? ?

## 2022-01-07 NOTE — Progress Notes (Signed)
PCP: Robin Register, MD ?HF Cardiology: Dr. Shirlee Latch ? ?71 y.o. with chronic systolic CHF was referred by Dr. [name] for evaluation of CHF.  Patient had no known cardiac history prior to 3/21.  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  Several weeks prior to 3/21 admission, she began to note exertional dyspnea.  This worsened to shortness of breath with minimal exertion and severe orthopnea.  She was admitted in 3/21 with dyspnea, CXR showed pulmonary edema and echo showed EF <20% with moderate LV dilation.  Low BP limited medication titration.  RHC/LHC showed no coronary disease, preserved cardiac output.  She was discharged home.   ? ?She says that she got "hives" when Sherryll Burger was increased to 49/51 but resolved when Entresto was decreased back to 24/26 bid.  ? ?Echo in 1/22 showed EF 30-35%, global hypokinesis. Cardiac MRI in 9/22 showed EF 54%, normal RV, no delayed enhancement.  ? ?She returns for followup of CHF.  Weight stable.  Occasional ankle edema.  No significant exertional dyspnea. No chest pain. No lightheadedness.  Overall doing well.  ? ?Labs (3/21): LDL 90 ?Labs (4/21): K 3.9, creatinine 0.89 => 0.94, TSH normal, BNP 355 ?Labs (6/21): K 3.7, creatinine 0.86 ?Labs (8/21): K 4.3, creatinine 0.83 ?Labs (10/21): K 3.6, creatinine 0.99, BNP 83 ?Labs (1/22): K 4.1, creatinine 0.82 ?Labs (7/22): K 3.9, creatinine 0.8 ?Labs (9/22): hgb 13.4 ?Labs (1/23): K 3.8, creatinine 0.77 ? ?PMH:  ?1. GERD ?2. Chronic systolic CHF: Diagnosed 3/21.  Nonischemic cardiomyopathy.  ?- Echo (3/21): EF < 20%, moderate LV dilation, normal RV, mild-moderate pericardial effusion.  ?- RHC/LHC (4/21): No CAD; mean RA 6, PA 39/19, mean PCWP 17, CI 2.67 ?- Echo (1/22): EF 30-35%, global hypokinesis, normal RV, trivial pericardial effusion.  ?- Echo (9/22): EF 54%, normal RV, no delayed enhancement. ? ?FH: Parents with MIs in their 26s, sister with "enlarged heart."   ? ?SH: Married  with 5 kids. No ETOH, drugs.  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  ? ?ROS: All systems reviewed and negative except as per HPI.  ? ?Current Outpatient Medications  ?Medication Sig Dispense Refill  ? famotidine (PEPCID) 20 MG tablet TAKE 1 TABLET BY MOUTH TWICE A DAY 180 tablet 3  ? furosemide (LASIX) 20 MG tablet TAKE 1 TABLET BY MOUTH EVERY DAY 90 tablet 1  ? metoprolol succinate (TOPROL-XL) 100 MG 24 hr tablet TAKE 1 TABLET BY MOUTH TWICE A DAY 180 tablet 2  ? Multiple Vitamins-Minerals (MULTIVITAMIN WITH MINERALS) tablet Take 1 tablet by mouth daily.    ? pantoprazole (PROTONIX) 40 MG tablet TAKE 1 TABLET BY MOUTH EVERY DAY 90 tablet 1  ? potassium chloride (KLOR-CON) 10 MEQ tablet Take 1 tablet (10 mEq total) by mouth daily. 90 tablet 3  ? sacubitril-valsartan (ENTRESTO) 24-26 MG Take 1 tablet by mouth 2 (two) times daily. 180 tablet 3  ? spironolactone (ALDACTONE) 25 MG tablet TAKE 1 TABLET (25 MG TOTAL) BY MOUTH DAILY. 90 tablet 3  ? dapagliflozin propanediol (FARXIGA) 10 MG TABS tablet Take 1 tablet (10 mg total) by mouth daily before breakfast. 90 tablet 30  ? ?No current facility-administered medications for this encounter.  ? ?Wt 75.3 kg (166 lb)   BMI 29.41 kg/m?  ?General: NAD ?Neck: No JVD, no thyromegaly or thyroid nodule.  ?Lungs: Clear to auscultation bilaterally with normal respiratory effort. ?CV: Nondisplaced PMI.  Heart regular S1/S2, no S3/S4, no murmur.  No peripheral  edema.  No carotid bruit.  Normal pedal pulses.  ?Abdomen: Soft, nontender, no hepatosplenomegaly, no distention.  ?Skin: Intact without lesions or rashes.  ?Neurologic: Alert and oriented x 3.  ?Psych: Normal affect. ?Extremities: No clubbing or cyanosis.  ?HEENT: Normal.  ? ? ?Assessment/Plan: ?1. Chronic systolic CHF: Nonischemic cardiomyopathy.  Echo in 3/21 with EF < 20%, moderate LV dilation.  LHC/RHC in 4/21 with no CAD, preserved cardiac output.  Etiology is uncertain, she does not use ETOH/drugs,  no definite family history. Possible viral myocarditis. Echo in 1/22 with EF higher at 30-35%.  Cardiac MRI in 9/22 showed EF up to 54%, no delayed enhancement.  NYHA class II symptoms; she is not volume overloaded.   ?- Continue Toprol XL 100 mg bid.  ?- Continue spironolactone 25 mg daily. BMET today.    ?- She was unable to tolerate increase in [place], continue 24/26 bid.  ?- Continue dapagliflozin 10 mg daily.  ?- Continue Lasix 20 mg daily.  ?- Will arrange for echo to confirm that EF remains improved.  ?2. Pericardial effusion: Small-moderate pericardial effusion on 3/21 echo, no tamponade.  Only trivial pericardial effusion on 1/22 echo.   ? ?Followup 6 months with APP, needs BMET in 3 months.  ? ?Robin Patel ?01/07/2022 ? ?

## 2022-01-15 ENCOUNTER — Telehealth: Payer: Self-pay | Admitting: Family Medicine

## 2022-01-15 NOTE — Telephone Encounter (Signed)
Copied from CRM [id number]. Topic: General - Other ?>> Jan 15, 2022  2:13 PM Traci Sermon wrote: ?Reason for CRM: [facility] service center called in stating pt needs a PA for echo, [phone] ext [phone]. Please advise. ?

## 2022-01-17 ENCOUNTER — Ambulatory Visit (HOSPITAL_COMMUNITY)
Admission: RE | Admit: 2022-01-17 | Discharge: 2022-01-17 | Disposition: A | Discharge status: Home / Self Care | Payer: Medicare HMO | Source: Ambulatory Visit | Attending: Family Medicine | Admitting: Family Medicine

## 2022-01-17 DIAGNOSIS — R0602 Shortness of breath: Secondary | ICD-10-CM | POA: Diagnosis not present

## 2022-01-17 DIAGNOSIS — R0600 Dyspnea, unspecified: Secondary | ICD-10-CM | POA: Diagnosis not present

## 2022-01-17 DIAGNOSIS — I5022 Chronic systolic (congestive) heart failure: Secondary | ICD-10-CM | POA: Diagnosis not present

## 2022-01-17 LAB — ECHOCARDIOGRAM COMPLETE
Area-P 1/2: 7.44 cm2
Calc EF: 58.7 %
S' Lateral: 3.2 cm
Single Plane A2C EF: 50.8 %
Single Plane A4C EF: 60.4 %

## 2022-01-17 NOTE — Progress Notes (Signed)
?  Echocardiogram ?2D Echocardiogram has been performed. ? ?Robin Patel ?01/17/2022, 3:58 PM ?

## 2022-01-17 NOTE — Telephone Encounter (Signed)
Echo was not ordered by [facility], They will need to reach out to her cardiologist. ?

## 2022-02-22 ENCOUNTER — Other Ambulatory Visit (HOSPITAL_COMMUNITY): Payer: Self-pay | Admitting: Cardiology

## 2022-05-10 ENCOUNTER — Other Ambulatory Visit (HOSPITAL_COMMUNITY): Payer: Self-pay | Admitting: *Deleted

## 2022-05-10 MED ORDER — ENTRESTO 24-26 MG PO TABS: 1.0000 | ORAL_TABLET | Freq: Two times a day (BID) | ORAL | 3 refills | Status: DC

## 2022-05-11 ENCOUNTER — Telehealth (HOSPITAL_COMMUNITY): Payer: Self-pay | Admitting: Surgery

## 2022-05-11 NOTE — Telephone Encounter (Signed)
Patient came to clinic because she was out of Entresto medication.  She reports that she called this AM and refills were sent to previous pharmacy indicated per [employer].  I gave patient 1 month samples of Entresto 24/26 mg.  I will also forward to [facility] Patient Advocate to look into further as patient says that she has previously received medications through patient assistance.  Samples of Entresto were given to the patient, quantity 1 bottle, Lot Number [id number] exp date 04/2024.

## 2022-05-15 ENCOUNTER — Telehealth (HOSPITAL_COMMUNITY): Payer: Self-pay | Admitting: Pharmacy Technician

## 2022-05-21 NOTE — Telephone Encounter (Signed)
Advanced Heart Failure Patient Advocate Encounter  Patient had questions about where she gets Entresto refills. Explained that she did have [employer] last year but this year we [facility] been [facility]. Offered to send Robin Patel to [employer] for [employer]. [facility] at [facility] for the time being.  No further action required.  [name], CPhT

## 2022-07-10 ENCOUNTER — Other Ambulatory Visit (HOSPITAL_COMMUNITY): Payer: Self-pay

## 2022-07-10 MED ORDER — DAPAGLIFLOZIN PROPANEDIOL 10 MG PO TABS: 10.0000 mg | ORAL_TABLET | Freq: Every day | ORAL | 30 refills | Status: DC

## 2022-09-06 ENCOUNTER — Other Ambulatory Visit (HOSPITAL_COMMUNITY): Payer: Self-pay | Admitting: *Deleted

## 2022-09-06 MED ORDER — POTASSIUM CHLORIDE CRYS ER 10 MEQ PO TBCR: 10.0000 meq | EXTENDED_RELEASE_TABLET | Freq: Every day | ORAL | 3 refills | Status: DC

## 2022-09-14 NOTE — Progress Notes (Signed)
PCP: Charlott Rakes, MD HF Cardiology: Dr. Aundra Dubin  72 y.o. with chronic systolic CHF was referred by Dr. Stanford Breed for evaluation of CHF.  Patient had no known cardiac history prior to 3/21.  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  Several weeks prior to 3/21 admission, she began to note exertional dyspnea.  This worsened to shortness of breath with minimal exertion and severe orthopnea.  She was admitted in 3/21 with dyspnea, CXR showed pulmonary edema and echo showed EF <20% with moderate LV dilation.  Low BP limited medication titration.  RHC/LHC showed no coronary disease, preserved cardiac output.  She was discharged home.    She says that she got "hives" when Delene Loll was increased to 49/51 but resolved when Entresto was decreased back to 24/26 bid.   Echo in 1/22 showed EF 30-35%, global hypokinesis. Cardiac MRI in 9/22 showed EF 54%, normal RV, no delayed enhancement.   She returns for followup of CHF.  Weight stable.  Occasional ankle edema.  No significant exertional dyspnea. No chest pain. No lightheadedness.  Overall doing well.   Labs (3/21): LDL 90 Labs (4/21): K 3.9, creatinine 0.89 => 0.94, TSH normal, BNP 355 Labs (6/21): K 3.7, creatinine 0.86 Labs (8/21): K 4.3, creatinine 0.83 Labs (10/21): K 3.6, creatinine 0.99, BNP 83 Labs (1/22): K 4.1, creatinine 0.82 Labs (7/22): K 3.9, creatinine 0.8 Labs (9/22): hgb 13.4 Labs (1/23): K 3.8, creatinine 0.77  PMH:  1. GERD 2. Chronic systolic CHF: Diagnosed 7/02.  Nonischemic cardiomyopathy.  - Echo (3/21): EF < 20%, moderate LV dilation, normal RV, mild-moderate pericardial effusion.  - RHC/LHC (4/21): No CAD; mean RA 6, PA 39/19, mean PCWP 17, CI 2.67 - Echo (1/22): EF 30-35%, global hypokinesis, normal RV, trivial pericardial effusion.  - Echo (9/22): EF 54%, normal RV, no delayed enhancement.  FH: Parents with MIs in their 14s, sister with "enlarged heart."    SH: Married  with 5 kids. No ETOH, drugs.  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   ROS: All systems reviewed and negative except as per HPI.   Current Outpatient Medications  Medication Sig Dispense Refill   dapagliflozin propanediol (FARXIGA) 10 MG TABS tablet Take 1 tablet (10 mg total) by mouth daily before breakfast. 90 tablet 30   famotidine (PEPCID) 20 MG tablet TAKE 1 TABLET BY MOUTH TWICE A DAY 180 tablet 3   furosemide (LASIX) 20 MG tablet TAKE 1 TABLET BY MOUTH EVERY DAY 90 tablet 1   metoprolol succinate (TOPROL-XL) 100 MG 24 hr tablet TAKE 1 TABLET BY MOUTH TWICE A DAY 180 tablet 3   Multiple Vitamins-Minerals (MULTIVITAMIN WITH MINERALS) tablet Take 1 tablet by mouth daily.     pantoprazole (PROTONIX) 40 MG tablet TAKE 1 TABLET BY MOUTH EVERY DAY 90 tablet 1   potassium chloride (KLOR-CON M10) 10 MEQ tablet Take 1 tablet (10 mEq total) by mouth daily. 90 tablet 3   sacubitril-valsartan (ENTRESTO) 24-26 MG Take 1 tablet by mouth 2 (two) times daily. 180 tablet 3   spironolactone (ALDACTONE) 25 MG tablet TAKE 1 TABLET (25 MG TOTAL) BY MOUTH DAILY. 90 tablet 3   No current facility-administered medications for this visit.   There were no vitals taken for this visit. General: NAD Neck: No JVD, no thyromegaly or thyroid nodule.  Lungs: Clear to auscultation bilaterally with normal respiratory effort. CV: Nondisplaced PMI.  Heart regular S1/S2, no S3/S4, no murmur.  No peripheral edema.  No carotid bruit.  Normal pedal pulses.  Abdomen: Soft, nontender, no hepatosplenomegaly, no distention.  Skin: Intact without lesions or rashes.  Neurologic: Alert and oriented x 3.  Psych: Normal affect. Extremities: No clubbing or cyanosis.  HEENT: Normal.    Assessment/Plan: 1. Chronic systolic CHF: Nonischemic cardiomyopathy.  Echo in 3/21 with EF < 20%, moderate LV dilation.  LHC/RHC in 4/21 with no CAD, preserved cardiac output.  Etiology is uncertain, she does not use  ETOH/drugs, no definite family history. Possible viral myocarditis. Echo in 1/22 with EF higher at 30-35%.  Cardiac MRI in 9/22 showed EF up to 54%, no delayed enhancement.  NYHA class II symptoms; she is not volume overloaded.   - Continue Toprol XL 100 mg bid.  - Continue spironolactone 25 mg daily. BMET today.    - She was unable to tolerate increase in [place], continue 24/26 bid.  - Continue dapagliflozin 10 mg daily.  - Continue Lasix 20 mg daily.  - Will arrange for echo to confirm that EF remains improved.  2. Pericardial effusion: Small-moderate pericardial effusion on 3/21 echo, no tamponade.  Only trivial pericardial effusion on 1/22 echo.    Followup 6 months with APP, needs BMET in 3 months.   [place]bert 09/14/2022

## 2022-09-18 ENCOUNTER — Ambulatory Visit (HOSPITAL_COMMUNITY)
Admission: RE | Admit: 2022-09-18 | Discharge: 2022-09-18 | Disposition: A | Discharge status: Home / Self Care | Payer: Medicare PPO | Source: Ambulatory Visit | Attending: Family Medicine | Admitting: Family Medicine

## 2022-09-18 ENCOUNTER — Encounter (HOSPITAL_COMMUNITY): Payer: Self-pay

## 2022-09-18 VITALS — BP 108/78 | HR 78 | Wt 163.8 lb

## 2022-09-18 DIAGNOSIS — Z79899 Other long term (current) drug therapy: Secondary | ICD-10-CM | POA: Insufficient documentation

## 2022-09-18 DIAGNOSIS — I3139 Other pericardial effusion (noninflammatory): Secondary | ICD-10-CM | POA: Diagnosis not present

## 2022-09-18 DIAGNOSIS — R079 Chest pain, unspecified: Secondary | ICD-10-CM | POA: Diagnosis not present

## 2022-09-18 DIAGNOSIS — I428 Other cardiomyopathies: Secondary | ICD-10-CM | POA: Insufficient documentation

## 2022-09-18 DIAGNOSIS — I5022 Chronic systolic (congestive) heart failure: Secondary | ICD-10-CM | POA: Insufficient documentation

## 2022-09-18 LAB — BASIC METABOLIC PANEL
Anion gap: 10 (ref 5–15)
BUN: 14 mg/dL (ref 8–23)
CO2: 23 mmol/L (ref 22–32)
Calcium: 8.9 mg/dL (ref 8.9–10.3)
Chloride: 104 mmol/L (ref 98–111)
Creatinine, Ser: 0.63 mg/dL (ref 0.44–1.00)
GFR, Estimated: >60 mL/min (ref >60)
Glucose, Bld: 94 mg/dL (ref 70–99)
Potassium: 3.5 mmol/L (ref 3.5–5.1)
Sodium: 137 mmol/L (ref 135–145)

## 2022-09-18 MED ORDER — PANTOPRAZOLE SODIUM 40 MG PO TBEC: 40.0000 mg | DELAYED_RELEASE_TABLET | Freq: Every day | ORAL | 1 refills | Status: DC

## 2022-09-18 NOTE — Patient Instructions (Signed)
It was great to see you today! No medication changes are needed at this time.   Labs today We will only contact you if something comes back abnormal or we need to make some changes. Otherwise no news is good news!   LABS NEEDED IN 3 MONTHS  Please be sure to schedule a follow up with your PCP  Your physician wants you to follow-up in: 6 months with Dr Aundra Dubin. You will receive a reminder letter in the mail two months in advance. If you don't receive a letter, please call our office to schedule the follow-up appointment.   Do the following things EVERYDAY: Weigh yourself in the morning before breakfast. Write it down and keep it in a log. Take your medicines as prescribed Eat low salt foods--Limit salt (sodium) to 2000 mg per day.  Stay as active as you can everyday Limit all fluids for the day to less than 2 liters  At the [facility], you and your health needs are our priority. As part of our continuing mission to provide you with exceptional heart care, we have created designated Provider Care Teams. These Care Teams include your primary Cardiologist (physician) and Advanced Practice Providers (APPs- Physician Assistants and Nurse Practitioners) who all work together to provide you with the care you need, when you need it.   You may see any of the following providers on your designated Care Team at your next follow up: Dr Glori Bickers Dr Loralie Champagne Dr. Roxana Hires, NP Lyda Jester, [place] [facility] [place], [place] Forestine Na, NP Audry Riles, PharmD   Please be sure to bring in all your medications bottles to every appointment.   If you have any questions or concerns before your next appointment please send [place] a message through [place] or call our office at [phone].    TO LEAVE A MESSAGE FOR THE NURSE SELECT OPTION 2, PLEASE LEAVE A MESSAGE INCLUDING: YOUR NAME DATE OF BIRTH CALL BACK NUMBER REASON FOR CALL**this is  important as we prioritize the call backs  YOU WILL RECEIVE A CALL BACK THE SAME DAY AS LONG AS YOU CALL BEFORE 4:00 PM

## 2022-11-06 ENCOUNTER — Other Ambulatory Visit (HOSPITAL_COMMUNITY): Payer: Self-pay

## 2022-11-06 ENCOUNTER — Other Ambulatory Visit (HOSPITAL_COMMUNITY): Payer: Self-pay | Admitting: Cardiology

## 2022-11-06 MED ORDER — FUROSEMIDE 20 MG PO TABS: 20.0000 mg | ORAL_TABLET | Freq: Every day | ORAL | 1 refills | Status: DC

## 2022-12-19 ENCOUNTER — Ambulatory Visit (HOSPITAL_COMMUNITY)
Admission: RE | Admit: 2022-12-19 | Discharge: 2022-12-19 | Disposition: A | Discharge status: Home / Self Care | Payer: Medicare PPO | Source: Ambulatory Visit | Attending: Internal Medicine | Admitting: Internal Medicine

## 2022-12-19 DIAGNOSIS — I5022 Chronic systolic (congestive) heart failure: Secondary | ICD-10-CM | POA: Diagnosis not present

## 2022-12-19 LAB — BASIC METABOLIC PANEL
Anion gap: 7 (ref 5–15)
BUN: 11 mg/dL (ref 8–23)
CO2: 25 mmol/L (ref 22–32)
Calcium: 8.9 mg/dL (ref 8.9–10.3)
Chloride: 109 mmol/L (ref 98–111)
Creatinine, Ser: 0.71 mg/dL (ref 0.44–1.00)
GFR, Estimated: >60 mL/min (ref >60)
Glucose, Bld: 123 mg/dL — ABNORMAL HIGH (ref 70–99)
Potassium: 3.2 mmol/L — ABNORMAL LOW (ref 3.5–5.1)
Sodium: 141 mmol/L (ref 135–145)

## 2022-12-20 ENCOUNTER — Other Ambulatory Visit (HOSPITAL_COMMUNITY): Payer: Self-pay

## 2022-12-20 MED ORDER — ENTRESTO 24-26 MG PO TABS: 1.0000 | ORAL_TABLET | Freq: Two times a day (BID) | ORAL | 0 refills | Status: DC

## 2022-12-25 ENCOUNTER — Encounter (HOSPITAL_COMMUNITY): Payer: Self-pay

## 2023-01-07 ENCOUNTER — Other Ambulatory Visit (HOSPITAL_COMMUNITY): Payer: Self-pay

## 2023-01-07 ENCOUNTER — Telehealth (HOSPITAL_COMMUNITY): Payer: Self-pay

## 2023-01-07 MED ORDER — POTASSIUM CHLORIDE CRYS ER 10 MEQ PO TBCR: 20.0000 meq | EXTENDED_RELEASE_TABLET | Freq: Every day | ORAL | 3 refills | Status: DC

## 2023-01-07 NOTE — Telephone Encounter (Signed)
Went over recent labs with patient. We discussed her medication change with Potassium. She had trouble understanding which medication, so we spent some time going over. I sent in refill for her so she would not be confused on which actual pill it is. She verbalized understanding with teachback method. She will call back to set up labs.

## 2023-02-20 ENCOUNTER — Other Ambulatory Visit (HOSPITAL_COMMUNITY): Payer: Self-pay

## 2023-02-20 MED ORDER — DAPAGLIFLOZIN PROPANEDIOL 10 MG PO TABS: 10.0000 mg | ORAL_TABLET | Freq: Every day | ORAL | 3 refills | Status: DC

## 2023-03-30 ENCOUNTER — Other Ambulatory Visit (HOSPITAL_COMMUNITY): Payer: Self-pay | Admitting: Cardiology

## 2023-03-30 ENCOUNTER — Other Ambulatory Visit: Payer: Self-pay | Admitting: Cardiology

## 2023-03-30 MED ORDER — METOPROLOL SUCCINATE ER 100 MG PO TB24: 100.0000 mg | ORAL_TABLET | Freq: Two times a day (BID) | ORAL | 3 refills | Status: DC

## 2023-04-03 ENCOUNTER — Ambulatory Visit (HOSPITAL_COMMUNITY)
Admission: RE | Admit: 2023-04-03 | Discharge: 2023-04-03 | Disposition: A | Discharge status: Home / Self Care | Payer: Medicare PPO | Source: Ambulatory Visit | Attending: Cardiology | Admitting: Cardiology

## 2023-04-03 ENCOUNTER — Encounter (HOSPITAL_COMMUNITY): Payer: Self-pay | Admitting: Cardiology

## 2023-04-03 VITALS — BP 140/80 | HR 69 | Wt 157.4 lb

## 2023-04-03 DIAGNOSIS — I428 Other cardiomyopathies: Secondary | ICD-10-CM | POA: Insufficient documentation

## 2023-04-03 DIAGNOSIS — I5022 Chronic systolic (congestive) heart failure: Secondary | ICD-10-CM | POA: Diagnosis not present

## 2023-04-03 DIAGNOSIS — I3139 Other pericardial effusion (noninflammatory): Secondary | ICD-10-CM | POA: Insufficient documentation

## 2023-04-03 DIAGNOSIS — R9431 Abnormal electrocardiogram [ECG] [EKG]: Secondary | ICD-10-CM | POA: Insufficient documentation

## 2023-04-03 LAB — LIPID PANEL
Cholesterol: 207 mg/dL — ABNORMAL HIGH (ref 0–200)
HDL: 53 mg/dL (ref >40)
LDL Cholesterol: 138 mg/dL — ABNORMAL HIGH (ref 0–99)
Total CHOL/HDL Ratio: 3.9 RATIO
Triglycerides: 79 mg/dL (ref <150)
VLDL: 16 mg/dL (ref 0–40)

## 2023-04-03 LAB — BASIC METABOLIC PANEL
Anion gap: 13 (ref 5–15)
BUN: 13 mg/dL (ref 8–23)
CO2: 25 mmol/L (ref 22–32)
Calcium: 9.2 mg/dL (ref 8.9–10.3)
Chloride: 101 mmol/L (ref 98–111)
Creatinine, Ser: 0.66 mg/dL (ref 0.44–1.00)
GFR, Estimated: >60 mL/min (ref >60)
Glucose, Bld: 105 mg/dL — ABNORMAL HIGH (ref 70–99)
Potassium: 3.4 mmol/L — ABNORMAL LOW (ref 3.5–5.1)
Sodium: 139 mmol/L (ref 135–145)

## 2023-04-03 LAB — BRAIN NATRIURETIC PEPTIDE: B Natriuretic Peptide: 11.9 pg/mL (ref 0.0–100.0)

## 2023-04-03 NOTE — Patient Instructions (Signed)
Good to see you today!  RESTART Toprol xl as previously prescribed  Your physician has requested that you have an echocardiogram. Echocardiography is a painless test that uses sound waves to create images of your heart. It provides your doctor with information about the size and shape of your heart and how well your heart's chambers and valves are working. This procedure takes approximately one hour. There are no restrictions for this procedure. Please do NOT wear cologne, perfume, aftershave, or lotions (deodorant is allowed). Please arrive 15 minutes prior to your appointment time.   Lab work done today we will call you with any abnormal results  Your physician recommends that you schedule a follow-up appointment in: 6 (February) Call office in December to schedule an appointment  If you have any questions or concerns before your next appointment please send [place] a message through [place] or call our office at [phone].    TO LEAVE A MESSAGE FOR THE NURSE SELECT OPTION 2, PLEASE LEAVE A MESSAGE INCLUDING: YOUR NAME DATE OF BIRTH CALL BACK NUMBER REASON FOR CALL**this is important as we prioritize the call backs  YOU WILL RECEIVE A CALL BACK THE SAME DAY AS LONG AS YOU CALL BEFORE 4:00 PM At the [facility], you and your health needs are our priority. As part of our continuing mission to provide you with exceptional heart care, we have created designated Provider Care Teams. These Care Teams include your primary Cardiologist (physician) and Advanced Practice Providers (APPs- Physician Assistants and Nurse Practitioners) who all work together to provide you with the care you need, when you need it.   You may see any of the following providers on your designated Care Team at your next follow up: Dr Arvilla Meres Dr Marca Ancona Dr. Marcos Eke, NP Robbie Lis, [place] [facility] [place], [place] Brynda Peon, NP Karle Plumber,  PharmD   Please be sure to bring in all your medications bottles to every appointment.    Thank you for choosing [facility]

## 2023-04-03 NOTE — Progress Notes (Signed)
PCP: Hoy Register, MD HF Cardiology: Dr. Shirlee Latch  72 y.o. with chronic systolic CHF was referred by Dr. [name] for evaluation of CHF.  Patient had no known cardiac history prior to 3/21.  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  Several weeks prior to 3/21 admission, she began to note exertional dyspnea.  This worsened to shortness of breath with minimal exertion and severe orthopnea.  She was admitted in 3/21 with dyspnea, CXR showed pulmonary edema and echo showed EF <20% with moderate LV dilation.  Low BP limited medication titration.  RHC/LHC showed no coronary disease, preserved cardiac output.  She was discharged home.    She says that she got "hives" when Sherryll Burger was increased to 49/51 but resolved when Entresto was decreased back to 24/26 bid.   Echo in 1/22 showed EF 30-35%, global hypokinesis. Cardiac MRI in 9/22 showed EF 54%, normal RV, no delayed enhancement.   Echo 5/23 showed EF 55-60%, normal RV  Today she returns for HF follow up. Weight down 6 lbs.  Ankles occasionally swell, does not feel like she can stop Lasix. She is short of breath if she misses her medications, but does well if she takes everything.  No dyspnea walking on flat ground or up a flight of stairs.  No chest pain.  No orthopnea/PND.  No lightheadedness. She has been out of Toprol XL.   ECG (personally reviewed): NSR, LAFB, poor RWP  Labs (3/21): LDL 90 Labs (4/21): K 3.9, creatinine 0.89 => 0.94, TSH normal, BNP 355 Labs (6/21): K 3.7, creatinine 0.86 Labs (8/21): K 4.3, creatinine 0.83 Labs (10/21): K 3.6, creatinine 0.99, BNP 83 Labs (1/22): K 4.1, creatinine 0.82 Labs (7/22): K 3.9, creatinine 0.8 Labs (9/22): hgb 13.4 Labs (1/23): K 3.8, creatinine 0.77 Labs (5/23): K 3.6, creatinine 0.73 Labs (4/24): K 3.2, creatinine 0.7  PMH:  1. GERD 2. Chronic systolic CHF: Diagnosed 3/21.  Nonischemic cardiomyopathy.  - Echo (3/21): EF < 20%, moderate LV  dilation, normal RV, mild-moderate pericardial effusion.  - RHC/LHC (4/21): No CAD; mean RA 6, PA 39/19, mean PCWP 17, CI 2.67 - Echo (1/22): EF 30-35%, global hypokinesis, normal RV, trivial pericardial effusion.  - Echo (9/22): EF 54%, normal RV, no delayed enhancement. - Echo (5/23): EF 55-60%, normal RV  FH: Parents with MIs in their 54s, sister with "enlarged heart."    SH: Married with 5 kids. No ETOH, drugs.  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   ROS: All systems reviewed and negative except as per HPI.   Current Outpatient Medications  Medication Sig Dispense Refill   dapagliflozin propanediol (FARXIGA) 10 MG TABS tablet Take 1 tablet (10 mg total) by mouth daily before breakfast. 90 tablet 3   famotidine (PEPCID) 20 MG tablet TAKE 1 TABLET BY MOUTH TWICE A DAY 180 tablet 3   furosemide (LASIX) 20 MG tablet Take 1 tablet (20 mg total) by mouth daily. 90 tablet 1   metoprolol succinate (TOPROL-XL) 100 MG 24 hr tablet TAKE 1 TABLET BY MOUTH TWICE A DAY 180 tablet 3   Multiple Vitamins-Minerals (MULTIVITAMIN WITH MINERALS) tablet Take 1 tablet by mouth daily.     pantoprazole (PROTONIX) 40 MG tablet Take 1 tablet (40 mg total) by mouth daily. 90 tablet 1   potassium chloride (KLOR-CON M10) 10 MEQ tablet Take 2 tablets (20 mEq total) by mouth daily. 60 tablet 3   sacubitril-valsartan (ENTRESTO) 24-26 MG Take 1 tablet by  mouth 2 (two) times daily. 30 tablet 0   spironolactone (ALDACTONE) 25 MG tablet TAKE 1 TABLET (25 MG TOTAL) BY MOUTH DAILY. 90 tablet 3   No current facility-administered medications for this encounter.   Wt Readings from Last 3 Encounters:  04/03/23 71.4 kg (157 lb 6.4 oz)  09/18/22 74.3 kg (163 lb 12.8 oz)  01/04/22 75.3 kg (166 lb)   BP (!) 140/80   Pulse 69   Wt 71.4 kg (157 lb 6.4 oz)   SpO2 98%   BMI 27.88 kg/m  General: NAD Neck: No JVD, no thyromegaly or thyroid nodule.  Lungs: Clear to auscultation bilaterally with normal  respiratory effort. CV: Nondisplaced PMI.  Heart regular S1/S2, no S3/S4, no murmur.  No peripheral edema.  No carotid bruit.  Normal pedal pulses.  Abdomen: Soft, nontender, no hepatosplenomegaly, no distention.  Skin: Intact without lesions or rashes.  Neurologic: Alert and oriented x 3.  Psych: Normal affect. Extremities: No clubbing or cyanosis.  HEENT: Normal.   Assessment/Plan: 1. Chronic systolic CHF: Nonischemic cardiomyopathy.  Echo in 3/21 with EF < 20%, moderate LV dilation.  LHC/RHC in 4/21 with no CAD, preserved cardiac output.  Etiology is uncertain, she does not use ETOH/drugs, no definite family history. Possible viral myocarditis. Echo in 1/22 with EF higher at 30-35%.  Cardiac MRI in 9/22 showed EF up to 54%, no delayed enhancement.  Echo 5/23 showed EF 55-60%, normal RV. NYHA class II, not volume overloaded on exam.   - Restart Toprol XL 100 mg bid.  - Continue spironolactone 25 mg daily. BMET/BNP today.    - Continue Entresto 24/26 bid (unable to tolerate higher doses).  - Continue dapagliflozin 10 mg daily. No GU symptoms. - Continue Lasix 20 mg daily.  - I will arrange for repeat echo.  2. Pericardial effusion: Small-moderate pericardial effusion on 3/21 echo, no tamponade.  Only trivial pericardial effusion on 1/22 echo.  Trivial pericardial effusion on 5/23 echo.  Follow up 6 months with APP as long as EF remains normal range on echo.   Marca Ancona  04/03/2023

## 2023-05-11 ENCOUNTER — Other Ambulatory Visit (HOSPITAL_COMMUNITY): Payer: Self-pay | Admitting: Cardiology

## 2023-05-14 ENCOUNTER — Other Ambulatory Visit (HOSPITAL_COMMUNITY): Payer: Self-pay

## 2023-05-14 MED ORDER — PANTOPRAZOLE SODIUM 40 MG PO TBEC: 40.0000 mg | DELAYED_RELEASE_TABLET | Freq: Every day | ORAL | 1 refills | Status: DC

## 2023-06-21 ENCOUNTER — Other Ambulatory Visit: Payer: Self-pay

## 2023-06-21 ENCOUNTER — Encounter: Payer: Self-pay | Admitting: Emergency Medicine

## 2023-06-21 ENCOUNTER — Emergency Department: Payer: Medicare PPO

## 2023-06-21 DIAGNOSIS — R42 Dizziness and giddiness: Secondary | ICD-10-CM | POA: Insufficient documentation

## 2023-06-21 DIAGNOSIS — I6782 Cerebral ischemia: Secondary | ICD-10-CM | POA: Diagnosis not present

## 2023-06-21 DIAGNOSIS — S0990XA Unspecified injury of head, initial encounter: Secondary | ICD-10-CM | POA: Diagnosis not present

## 2023-06-21 DIAGNOSIS — G9389 Other specified disorders of brain: Secondary | ICD-10-CM | POA: Diagnosis not present

## 2023-06-21 DIAGNOSIS — Y92410 Unspecified street and highway as the place of occurrence of the external cause: Secondary | ICD-10-CM | POA: Diagnosis not present
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
## 2023-06-22 ENCOUNTER — Emergency Department
Admission: EM | Admit: 2023-06-22 | Discharge: 2023-06-22 | Disposition: A | Discharge status: Home / Self Care | Payer: Medicare PPO | Attending: Emergency Medicine | Admitting: Emergency Medicine

## 2023-06-22 DIAGNOSIS — R42 Dizziness and giddiness: Secondary | ICD-10-CM

## 2023-06-22 NOTE — Discharge Instructions (Signed)
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
## 2023-06-28 ENCOUNTER — Other Ambulatory Visit (HOSPITAL_COMMUNITY): Payer: Self-pay | Admitting: Cardiology

## 2023-06-28 MED ORDER — POTASSIUM CHLORIDE CRYS ER 10 MEQ PO TBCR: 20.0000 meq | EXTENDED_RELEASE_TABLET | Freq: Every day | ORAL | 3 refills | Status: DC

## 2023-07-11 ENCOUNTER — Telehealth (HOSPITAL_COMMUNITY): Payer: Self-pay | Admitting: Cardiology

## 2023-07-11 ENCOUNTER — Telehealth (HOSPITAL_COMMUNITY): Payer: Self-pay | Admitting: Pharmacy Technician

## 2023-07-11 MED ORDER — ENTRESTO 24-26 MG PO TABS: 1.0000 | ORAL_TABLET | Freq: Two times a day (BID) | ORAL | 1 refills | Status: DC

## 2023-07-11 MED ORDER — SPIRONOLACTONE 25 MG PO TABS: 25.0000 mg | ORAL_TABLET | Freq: Every day | ORAL | 3 refills | Status: DC

## 2023-07-11 MED ORDER — METOPROLOL SUCCINATE ER 100 MG PO TB24: 100.0000 mg | ORAL_TABLET | Freq: Two times a day (BID) | ORAL | 3 refills | Status: DC

## 2023-07-11 MED ORDER — DAPAGLIFLOZIN PROPANEDIOL 10 MG PO TABS: 10.0000 mg | ORAL_TABLET | Freq: Every day | ORAL | 3 refills | Status: DC

## 2023-07-11 NOTE — Telephone Encounter (Signed)
Advanced Heart Failure Patient Advocate Encounter  The patient was approved for a [employer] grant that will help cover the cost of Entresto, Farxiga, Metoprolol and Spironolactone. Total amount awarded, $10,000. Eligibility, 06/11/23 - 06/09/24.  ID [id number]  BIN [id number]  PCN [id number]  Group [id number]  Copy of grant information sent in to [facility].  Archer Asa, CPhT

## 2023-07-11 NOTE — Telephone Encounter (Signed)
Pt walked in to request entresto samples Pt has used HF grant in the past for co pay assistance  Grant reapplied per pharmacy team Card No. [id number] BIN [id number] PCN [id number] PC Group [id number]   Refills sent to pharm  Entresto spiro metoprolol farxiga

## 2023-07-15 ENCOUNTER — Encounter (HOSPITAL_COMMUNITY): Payer: Medicare PPO
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
## 2023-07-16 ENCOUNTER — Ambulatory Visit (HOSPITAL_COMMUNITY)
Admission: RE | Admit: 2023-07-16 | Discharge: 2023-07-16 | Disposition: A | Discharge status: Home / Self Care | Payer: No Typology Code available for payment source | Source: Ambulatory Visit | Attending: Internal Medicine | Admitting: Internal Medicine

## 2023-07-16 ENCOUNTER — Encounter (HOSPITAL_COMMUNITY): Payer: Self-pay

## 2023-07-16 VITALS — BP 140/84 | HR 71 | Wt 166.0 lb

## 2023-07-16 DIAGNOSIS — I428 Other cardiomyopathies: Secondary | ICD-10-CM | POA: Insufficient documentation

## 2023-07-16 DIAGNOSIS — E785 Hyperlipidemia, unspecified: Secondary | ICD-10-CM | POA: Diagnosis not present

## 2023-07-16 DIAGNOSIS — R079 Chest pain, unspecified: Secondary | ICD-10-CM | POA: Insufficient documentation

## 2023-07-16 DIAGNOSIS — Z79899 Other long term (current) drug therapy: Secondary | ICD-10-CM | POA: Diagnosis not present

## 2023-07-16 DIAGNOSIS — I3139 Other pericardial effusion (noninflammatory): Secondary | ICD-10-CM | POA: Insufficient documentation

## 2023-07-16 DIAGNOSIS — I5022 Chronic systolic (congestive) heart failure: Secondary | ICD-10-CM | POA: Diagnosis not present

## 2023-07-16 LAB — BASIC METABOLIC PANEL
Anion gap: 5 (ref 5–15)
BUN: 12 mg/dL (ref 8–23)
CO2: 25 mmol/L (ref 22–32)
Calcium: 8.7 mg/dL — ABNORMAL LOW (ref 8.9–10.3)
Chloride: 106 mmol/L (ref 98–111)
Creatinine, Ser: 0.76 mg/dL (ref 0.44–1.00)
GFR, Estimated: >60 mL/min (ref >60)
Glucose, Bld: 98 mg/dL (ref 70–99)
Potassium: 3.6 mmol/L (ref 3.5–5.1)
Sodium: 136 mmol/L (ref 135–145)

## 2023-07-16 LAB — BRAIN NATRIURETIC PEPTIDE: B Natriuretic Peptide: 34.8 pg/mL (ref 0.0–100.0)

## 2023-07-16 MED ORDER — ATORVASTATIN CALCIUM 40 MG PO TABS: 40.0000 mg | ORAL_TABLET | Freq: Every day | ORAL | 3 refills | Status: DC

## 2023-07-16 NOTE — Patient Instructions (Addendum)
Thank you for coming in today  If you had labs drawn today, any labs that are abnormal the clinic will call you No news is good news  Medications: START atorvastatin 40 mg 1 tablet daily  Follow up appointments:  Your physician recommends that you schedule a follow-up appointment in:  3-4 months in clinic You will receive a reminder letter in the mail a few months in advance. If you don't receive a letter, please call our office to schedule the follow-up appointment.   Your physician has requested that you have an echocardiogram. Echocardiography is a painless test that uses sound waves to create images of your heart. It provides your doctor with information about the size and shape of your heart and how well your heart's chambers and valves are working. This procedure takes approximately one hour. There are no restrictions for this procedure.      Do the following things EVERYDAY: Weigh yourself in the morning before breakfast. Write it down and keep it in a log. Take your medicines as prescribed Eat low salt foods--Limit salt (sodium) to 2000 mg per day.  Stay as active as you can everyday Limit all fluids for the day to less than 2 liters   At the [facility], you and your health needs are our priority. As part of our continuing mission to provide you with exceptional heart care, we have created designated Provider Care Teams. These Care Teams include your primary Cardiologist (physician) and Advanced Practice Providers (APPs- Physician Assistants and Nurse Practitioners) who all work together to provide you with the care you need, when you need it.   You may see any of the following providers on your designated Care Team at your next follow up: Dr Arvilla Meres Dr Marca Ancona Dr. Marcos Eke, NP Robbie Lis, [place] [facility], [place] Brynda Peon, NP Karle Plumber, PharmD   Please be sure to bring in all your  medications bottles to every appointment.    Thank you for choosing [facility]  If you have any questions or concerns before your next appointment please send [place] a message through [place] or call our office at [phone].    TO LEAVE A MESSAGE FOR THE NURSE SELECT OPTION 2, PLEASE LEAVE A MESSAGE INCLUDING: YOUR NAME DATE OF BIRTH CALL BACK NUMBER REASON FOR CALL**this is important as we prioritize the call backs  YOU WILL RECEIVE A CALL BACK THE SAME DAY AS LONG AS YOU CALL BEFORE 4:00 PM

## 2023-09-02 ENCOUNTER — Ambulatory Visit (HOSPITAL_COMMUNITY): Admission: RE | Admit: 2023-09-02 | Payer: Medicare PPO | Source: Ambulatory Visit

## 2023-10-14 ENCOUNTER — Telehealth (HOSPITAL_COMMUNITY): Payer: Self-pay | Admitting: Cardiology

## 2023-10-14 MED ORDER — SACUBITRIL-VALSARTAN 24-26 MG PO TABS: 1.0000 | ORAL_TABLET | Freq: Two times a day (BID) | ORAL | 1 refills | Status: DC

## 2023-10-14 MED ORDER — PANTOPRAZOLE SODIUM 40 MG PO TBEC: 40.0000 mg | DELAYED_RELEASE_TABLET | Freq: Every day | ORAL | 1 refills | Status: DC

## 2023-10-14 NOTE — Telephone Encounter (Signed)
 Patient is out of "Entresto " and 'pantoprazole ".  Patients would like to speak with clinical staff ; her telephone number is [phone].   Please give this patient a call back.   Please advise.

## 2023-10-14 NOTE — Addendum Note (Signed)
 Addended by: Edris Gowers on: 10/14/2023 01:58 PM   Modules accepted: Orders

## 2023-10-14 NOTE — Telephone Encounter (Signed)
-  attempted to return call no answer unable to leave message  -refills returned

## 2023-10-16 ENCOUNTER — Telehealth (HOSPITAL_COMMUNITY): Payer: Self-pay | Admitting: Adult Health

## 2023-10-16 NOTE — Telephone Encounter (Signed)
Called patient at [phone] to schedule a follow up appointment with the APP Clinic in the [facility] at [facility].  Front office was unable to leave a voice message for this patient for her to call back to schedule an appointment.   Patient is also not signed up for MyChart, unable to send patient a message through MyChart portal.

## 2023-10-21 ENCOUNTER — Telehealth (HOSPITAL_COMMUNITY): Payer: Self-pay

## 2023-10-21 NOTE — Telephone Encounter (Signed)
Received a fax requesting medical records from [employer]. Records were successfully faxed to: [phone] ,which was the number provided.. Medical request form will be scanned into patients chart.

## 2023-12-20 ENCOUNTER — Telehealth (HOSPITAL_COMMUNITY): Payer: Self-pay | Admitting: Cardiology

## 2023-12-20 NOTE — Telephone Encounter (Signed)
 Called patient to schedule a f/u appt with the APP Clinic. Patient number is not working nd the call could not be completed. Front office personnel unnble to reach patient and unable to speak to patient directly to schedule appt.

## 2024-01-14 ENCOUNTER — Other Ambulatory Visit (HOSPITAL_COMMUNITY): Payer: Self-pay | Admitting: Cardiology

## 2024-01-14 MED ORDER — FUROSEMIDE 20 MG PO TABS: 20.0000 mg | ORAL_TABLET | Freq: Every day | ORAL | 1 refills | Status: DC

## 2024-01-14 MED ORDER — METOPROLOL SUCCINATE ER 100 MG PO TB24: 100.0000 mg | ORAL_TABLET | Freq: Two times a day (BID) | ORAL | 3 refills | Status: DC

## 2024-01-14 MED ORDER — ENTRESTO 24-26 MG PO TABS: 1.0000 | ORAL_TABLET | Freq: Two times a day (BID) | ORAL | 1 refills | Status: AC

## 2024-01-14 MED ORDER — SPIRONOLACTONE 25 MG PO TABS: 25.0000 mg | ORAL_TABLET | Freq: Every day | ORAL | 3 refills | Status: AC

## 2024-01-14 MED ORDER — POTASSIUM CHLORIDE CRYS ER 10 MEQ PO TBCR: 20.0000 meq | EXTENDED_RELEASE_TABLET | Freq: Every day | ORAL | 3 refills | Status: DC

## 2024-01-14 MED ORDER — DAPAGLIFLOZIN PROPANEDIOL 10 MG PO TABS: 10.0000 mg | ORAL_TABLET | Freq: Every day | ORAL | 3 refills | Status: AC

## 2024-01-14 MED ORDER — ATORVASTATIN CALCIUM 40 MG PO TABS: 40.0000 mg | ORAL_TABLET | Freq: Every day | ORAL | 3 refills | Status: DC

## 2024-02-13 ENCOUNTER — Telehealth (HOSPITAL_COMMUNITY): Payer: Self-pay | Admitting: Cardiology
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
## 2024-03-03 ENCOUNTER — Ambulatory Visit (HOSPITAL_COMMUNITY)
Admission: RE | Admit: 2024-03-03 | Discharge: 2024-03-03 | Disposition: A | Discharge status: Home / Self Care | Source: Ambulatory Visit | Attending: Family Medicine | Admitting: Family Medicine

## 2024-03-03 DIAGNOSIS — I5022 Chronic systolic (congestive) heart failure: Secondary | ICD-10-CM | POA: Insufficient documentation

## 2024-03-03 LAB — ECHOCARDIOGRAM COMPLETE
Area-P 1/2: 3.27 cm2
S' Lateral: 3.7 cm

## 2024-03-03 NOTE — Progress Notes (Signed)
 Echocardiogram 2D Echocardiogram has been performed.  Koleen KANDICE Popper, RDCS 03/03/2024, 3:50 PM

## 2024-03-04 ENCOUNTER — Ambulatory Visit (HOSPITAL_COMMUNITY): Payer: Self-pay | Admitting: Cardiology

## 2024-03-24 ENCOUNTER — Inpatient Hospital Stay (HOSPITAL_COMMUNITY)
Admission: RE | Admit: 2024-03-24 | Discharge: 2024-03-24 | Discharge status: Home / Self Care | Source: Ambulatory Visit | Attending: Cardiology | Admitting: Cardiology

## 2024-03-24 VITALS — BP 110/64 | HR 75 | Ht 63.0 in | Wt 162.6 lb

## 2024-03-24 DIAGNOSIS — I428 Other cardiomyopathies: Secondary | ICD-10-CM | POA: Diagnosis not present

## 2024-03-24 DIAGNOSIS — I509 Heart failure, unspecified: Secondary | ICD-10-CM

## 2024-03-24 DIAGNOSIS — Z79899 Other long term (current) drug therapy: Secondary | ICD-10-CM | POA: Diagnosis not present

## 2024-03-24 DIAGNOSIS — I5022 Chronic systolic (congestive) heart failure: Secondary | ICD-10-CM

## 2024-03-24 DIAGNOSIS — E785 Hyperlipidemia, unspecified: Secondary | ICD-10-CM | POA: Diagnosis not present

## 2024-03-24 DIAGNOSIS — R5383 Other fatigue: Secondary | ICD-10-CM | POA: Diagnosis not present

## 2024-03-24 LAB — BASIC METABOLIC PANEL WITH GFR
Anion gap: 8 (ref 5–15)
BUN: 17 mg/dL (ref 8–23)
CO2: 23 mmol/L (ref 22–32)
Calcium: 9.3 mg/dL (ref 8.9–10.3)
Chloride: 108 mmol/L (ref 98–111)
Creatinine, Ser: 0.89 mg/dL (ref 0.44–1.00)
GFR, Estimated: >60 mL/min
Glucose, Bld: 89 mg/dL (ref 70–99)
Potassium: 3.8 mmol/L (ref 3.5–5.1)
Sodium: 139 mmol/L (ref 135–145)

## 2024-03-24 LAB — LIPID PANEL
Cholesterol: 196 mg/dL (ref 0–200)
HDL: 62 mg/dL
LDL Cholesterol: 111 mg/dL — ABNORMAL HIGH (ref 0–99)
Total CHOL/HDL Ratio: 3.2 ratio
Triglycerides: 114 mg/dL
VLDL: 23 mg/dL (ref 0–40)

## 2024-03-24 MED ORDER — FUROSEMIDE 20 MG PO TABS: 20.0000 mg | ORAL_TABLET | ORAL | Status: DC | PRN

## 2024-03-24 MED ORDER — POTASSIUM CHLORIDE CRYS ER 10 MEQ PO TBCR
20.0000 meq | EXTENDED_RELEASE_TABLET | ORAL | Status: DC | PRN
Start: 2024-03-24 — End: 2024-05-13

## 2024-03-24 NOTE — Progress Notes (Signed)
 ADVANCED HF CLINIC NOTE   PCP: Delbert Clam, MD HF Cardiology: Dr. Rolan  Chief complaint: CHF  73 y.o. with chronic systolic CHF was referred by Dr. Pietro for evaluation of CHF.  Patient had no known cardiac history prior to 3/21.  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  Several weeks prior to 3/21 admission, she began to note exertional dyspnea.  This worsened to shortness of breath with minimal exertion and severe orthopnea.  She was admitted in 3/21 with dyspnea, CXR showed pulmonary edema and echo showed EF <20% with moderate LV dilation.  Low BP limited medication titration.  RHC/LHC showed no coronary disease, preserved cardiac output.  She was discharged home.    She says that she got hives when Entresto  was increased to 49/51 but resolved when Entresto  was decreased back to 24/26 bid.   Echo in 1/22 showed EF 30-35%, global hypokinesis. Cardiac MRI in 9/22 showed EF 54%, normal RV, no delayed enhancement.   Echo 5/23 showed EF 55-60%, normal RV  Echo was done today and reviewed, EF 60-65%, normal RV, IVC normal.   Patient returns for followup of CHF.  Her main complaint is fatigue.  She works a lot around the house taking care of her disabled son and her husband with dementia.  She has rare atypical chest pain (not exertional).  She denies exertional dyspnea. No problems walking up a flight of stairs.  Weight down 4 lbs.   ECG (personally reviewed): NSR, septal Qs  Labs (4/24): K 3.2, creatinine 0.7 Labs (7/24): K 3.4, SCr 0.66, LDL 138 Labs (11/24): K 3.6, creatinine 0.76  PMH:  1. GERD 2. Chronic systolic CHF: Diagnosed 3/21.  Nonischemic cardiomyopathy.  - Echo (3/21): EF < 20%, moderate LV dilation, normal RV, mild-moderate pericardial effusion.  - RHC/LHC (4/21): No CAD; mean RA 6, PA 39/19, mean PCWP 17, CI 2.67 - Echo (1/22): EF 30-35%, global hypokinesis, normal RV, trivial pericardial effusion.  - Echo  (9/22): EF 54%, normal RV, no delayed enhancement. - Echo (5/23): EF 55-60%, normal RV - Echo (7/25): EF 60-65%, normal RV, IVC normal.   FH: Parents with MIs in their 70s, sister with enlarged heart.    SH: Married with 5 kids. No ETOH, drugs.  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   ROS: All systems reviewed and negative except as per HPI.   Current Outpatient Medications  Medication Sig Dispense Refill   atorvastatin  (LIPITOR) 40 MG tablet Take 1 tablet (40 mg total) by mouth daily. 90 tablet 3   dapagliflozin  propanediol (FARXIGA ) 10 MG TABS tablet Take 1 tablet (10 mg total) by mouth daily before breakfast. 90 tablet 3   famotidine  (PEPCID ) 20 MG tablet TAKE 1 TABLET BY MOUTH TWICE A DAY 180 tablet 3   metoprolol  succinate (TOPROL -XL) 100 MG 24 hr tablet Take 1 tablet (100 mg total) by mouth 2 (two) times daily. 180 tablet 3   Multiple Vitamins-Minerals (MULTIVITAMIN WITH MINERALS) tablet Take 1 tablet by mouth daily.     pantoprazole  (PROTONIX ) 40 MG tablet Take 1 tablet (40 mg total) by mouth daily. 90 tablet 1   sacubitril -valsartan  (ENTRESTO ) 24-26 MG Take 1 tablet by mouth 2 (two) times daily. 180 tablet 1   spironolactone  (ALDACTONE ) 25 MG tablet Take 1 tablet (25 mg total) by mouth daily. 90 tablet 3   furosemide  (LASIX ) 20 MG tablet Take 1 tablet (20 mg total) by mouth as needed. Weight  gain of 3lb in 24 hours or 5 lb in a week     potassium chloride  (KLOR-CON  M10) 10 MEQ tablet Take 2 tablets (20 mEq total) by mouth as needed. Take when you use Lasix      No current facility-administered medications for this encounter.   Wt Readings from Last 3 Encounters:  03/24/24 73.8 kg (162 lb 9.6 oz)  07/16/23 75.3 kg (166 lb)  04/03/23 71.4 kg (157 lb 6.4 oz)   BP 110/64   Pulse 75   Ht 5' 3 (1.6 m)   Wt 73.8 kg (162 lb 9.6 oz)   SpO2 96%   BMI 28.80 kg/m   Physical exam: General: NAD Neck: No JVD, no thyromegaly or thyroid nodule.  Lungs: Clear to  auscultation bilaterally with normal respiratory effort. CV: Nondisplaced PMI.  Heart regular S1/S2, no S3/S4, no murmur.  No peripheral edema.  No carotid bruit.  Normal pedal pulses.  Abdomen: Soft, nontender, no hepatosplenomegaly, no distention.  Skin: Intact without lesions or rashes.  Neurologic: Alert and oriented x 3.  Psych: Normal affect. Extremities: No clubbing or cyanosis.  HEENT: Normal.   Assessment/Plan: 1. Chronic systolic CHF: Nonischemic cardiomyopathy.  Echo in 3/21 with EF < 20%, moderate LV dilation.  LHC/RHC in 4/21 with no CAD, preserved cardiac output.  Etiology is uncertain, she does not use ETOH/drugs, no definite family history. Possible viral myocarditis. Echo in 1/22 with EF higher at 30-35%.  Cardiac MRI in 9/22 showed EF up to 54%, no delayed enhancement.  Echo 5/23 EF 55-60%, normal RV.  Echo in 7/25 showed EF 60-65%, normal RV, IVC normal.  She is not volume overloaded on exam, NYHA class II due to fatigue.  - Continue Toprol  XL 100 mg bid.  - Continue spironolactone  25 mg daily. BMET today.    - Continue Entresto  24/26 bid (unable to tolerate higher doses).  - Continue dapagliflozin  10 mg daily.  - She can stop Lasix  and KCl.  2. Hyperlipidemia: Check lipids today.  BMET q3 months.  Followup 1 year.   I spent 32 minutes reviewing records, interviewing/examining patient, and managing orders.   Ezra Shuck 03/24/2024

## 2024-03-24 NOTE — Patient Instructions (Signed)
 CHANGE Lasix  to as needed for weight gain of 3lb in 24 hours or 5 lb in a week.  CHANGE Potassium to as needed. ONLY TAKE WHEN YOU USE YOUR LASIX .  Labs done today, your results will be available in MyChart, we will contact you for abnormal readings.  Your physician recommends that you schedule a follow-up appointment in: 1 YEAR ( July 2026) ** PLEASE CALL THE OFFICE IN MAY 2026 TO ARRANGE YOUR FOLLOW UP APPOINTMENT.**  If you have any questions or concerns before your next appointment please send us  a message through [place] or call our office at [phone].    TO LEAVE A MESSAGE FOR THE NURSE SELECT OPTION 2, PLEASE LEAVE A MESSAGE INCLUDING: YOUR NAME DATE OF BIRTH CALL BACK NUMBER REASON FOR CALL**this is important as we prioritize the call backs  YOU WILL RECEIVE A CALL BACK THE SAME DAY AS LONG AS YOU CALL BEFORE 4:00 PM  At the [facility], you and your health needs are our priority. As part of our continuing mission to provide you with exceptional heart care, we have created designated Provider Care Teams. These Care Teams include your primary Cardiologist (physician) and Advanced Practice Providers (APPs- Physician Assistants and Nurse Practitioners) who all work together to provide you with the care you need, when you need it.   You may see any of the following providers on your designated Care Team at your next follow up: Dr Toribio Fuel Dr Ezra Shuck Dr. Ria Commander Dr. Morene Brownie Amy Lenetta, NP Caffie Shed, [place] [facility] [place], [place] Beckey Coe, NP [place] Lee, NP Ellouise Class, NP Tinnie Redman, PharmD Jaun Bash, PharmD   Please be sure to bring in all your medications bottles to every appointment.    Thank you for choosing [facility]

## 2024-03-25 ENCOUNTER — Ambulatory Visit (HOSPITAL_COMMUNITY): Payer: Self-pay | Admitting: Cardiology

## 2024-03-27 NOTE — Progress Notes (Signed)
Unable to reach patient or leave voice mail

## 2024-05-13 ENCOUNTER — Other Ambulatory Visit (HOSPITAL_COMMUNITY): Payer: Self-pay | Admitting: Cardiology

## 2024-05-13 ENCOUNTER — Encounter (HOSPITAL_COMMUNITY): Payer: Self-pay

## 2024-05-13 ENCOUNTER — Ambulatory Visit (HOSPITAL_COMMUNITY)
Admission: RE | Admit: 2024-05-13 | Discharge: 2024-05-13 | Disposition: A | Discharge status: Home / Self Care | Source: Ambulatory Visit | Attending: Cardiology | Admitting: Cardiology

## 2024-05-13 ENCOUNTER — Telehealth (HOSPITAL_COMMUNITY): Payer: Self-pay | Admitting: Cardiology

## 2024-05-13 VITALS — BP 118/76 | HR 89 | Ht 63.0 in | Wt 163.4 lb

## 2024-05-13 DIAGNOSIS — I444 Left anterior fascicular block: Secondary | ICD-10-CM | POA: Diagnosis not present

## 2024-05-13 DIAGNOSIS — E8770 Fluid overload, unspecified: Secondary | ICD-10-CM | POA: Diagnosis not present

## 2024-05-13 DIAGNOSIS — R0789 Other chest pain: Secondary | ICD-10-CM | POA: Insufficient documentation

## 2024-05-13 DIAGNOSIS — R5383 Other fatigue: Secondary | ICD-10-CM | POA: Diagnosis not present

## 2024-05-13 DIAGNOSIS — Z7984 Long term (current) use of oral hypoglycemic drugs: Secondary | ICD-10-CM | POA: Diagnosis not present

## 2024-05-13 DIAGNOSIS — E785 Hyperlipidemia, unspecified: Secondary | ICD-10-CM | POA: Insufficient documentation

## 2024-05-13 DIAGNOSIS — R42 Dizziness and giddiness: Secondary | ICD-10-CM | POA: Diagnosis not present

## 2024-05-13 DIAGNOSIS — I428 Other cardiomyopathies: Secondary | ICD-10-CM | POA: Diagnosis not present

## 2024-05-13 DIAGNOSIS — I5022 Chronic systolic (congestive) heart failure: Secondary | ICD-10-CM | POA: Insufficient documentation

## 2024-05-13 DIAGNOSIS — Z79899 Other long term (current) drug therapy: Secondary | ICD-10-CM | POA: Diagnosis not present

## 2024-05-13 DIAGNOSIS — R0609 Other forms of dyspnea: Secondary | ICD-10-CM | POA: Insufficient documentation

## 2024-05-13 LAB — COMPREHENSIVE METABOLIC PANEL WITH GFR
ALT: 16 U/L (ref 0–44)
AST: 19 U/L (ref 15–41)
Albumin: 3.2 g/dL — ABNORMAL LOW (ref 3.5–5.0)
Alkaline Phosphatase: 79 U/L (ref 38–126)
Anion gap: 8 (ref 5–15)
BUN: 13 mg/dL (ref 8–23)
CO2: 22 mmol/L (ref 22–32)
Calcium: 8.8 mg/dL — ABNORMAL LOW (ref 8.9–10.3)
Chloride: 110 mmol/L (ref 98–111)
Creatinine, Ser: 0.69 mg/dL (ref 0.44–1.00)
GFR, Estimated: >60 mL/min (ref >60)
Glucose, Bld: 94 mg/dL (ref 70–99)
Potassium: 3.5 mmol/L (ref 3.5–5.1)
Sodium: 140 mmol/L (ref 135–145)
Total Bilirubin: 0.5 mg/dL (ref 0.0–1.2)
Total Protein: 7.4 g/dL (ref 6.5–8.1)

## 2024-05-13 LAB — BRAIN NATRIURETIC PEPTIDE: B Natriuretic Peptide: 10.1 pg/mL (ref 0.0–100.0)

## 2024-05-13 MED ORDER — ATORVASTATIN CALCIUM 80 MG PO TABS: 80.0000 mg | ORAL_TABLET | Freq: Every day | ORAL | 3 refills | Status: AC

## 2024-05-13 MED ORDER — METOPROLOL SUCCINATE ER 50 MG PO TB24: 50.0000 mg | ORAL_TABLET | Freq: Two times a day (BID) | ORAL | 3 refills | Status: AC

## 2024-05-13 NOTE — Telephone Encounter (Signed)
 Patient called to request add on appt Reports increase in dizziness since recent medication change  Reports weight is stable at 160 Reports she is dizzy all the time, increased of the past few days Reports mild edema Denies SOB No home vitals available  Unable to explain prn lasix /potassium schedule   Add on 9/10@ 3 Advised to bring all medications

## 2024-05-13 NOTE — Patient Instructions (Signed)
 Medication Changes:  ONLY TAKE FUROSEMIDE  AND POTASSIUM AS NEEDED FOR SWELLING OR WEIGHT GAIN   INCREASE ATORVASTATIN  TO 80MG  ONCE DAILY   DECREASE METOPROLOL  SUCCINATE TO 50MG  TWICE DAILY   Lab Work:  Labs done today, your results will be available in MyChart, we will contact you for abnormal readings.  THEN LABS AGAIN IN 2 MONTHS AS SCHEDULED   Follow-Up in: 6 MONTHS WITH APP AS SCHEDULED   At the [facility], you and your health needs are our priority. We have a designated team specialized in the treatment of Heart Failure. This Care Team includes your primary Heart Failure Specialized Cardiologist (physician), Advanced Practice Providers (APPs- Physician Assistants and Nurse Practitioners), and Pharmacist who all work together to provide you with the care you need, when you need it.   You may see any of the following providers on your designated Care Team at your next follow up:  Dr. Toribio Fuel Dr. Ezra Shuck Dr. Ria Commander Dr. Odis Brownie Greig Mosses, NP Caffie Shed, [place] [facility] [place], [place] Beckey Coe, NP [place] Lee, NP Tinnie Redman, PharmD   Please be sure to bring in all your medications bottles to every appointment.   Need to Contact Us :  If you have any questions or concerns before your next appointment please send us  a message through [place] or call our office at [phone].    TO LEAVE A MESSAGE FOR THE NURSE SELECT OPTION 2, PLEASE LEAVE A MESSAGE INCLUDING: YOUR NAME DATE OF BIRTH CALL BACK NUMBER REASON FOR CALL**this is important as we prioritize the call backs  YOU WILL RECEIVE A CALL BACK THE SAME DAY AS LONG AS YOU CALL BEFORE 4:00 PM '

## 2024-05-13 NOTE — Progress Notes (Signed)
 ADVANCED HF CLINIC NOTE   PCP: Delbert Clam, MD HF Cardiology: Dr. Rolan  HPI: 73 y.o. with chronic systolic CHF was referred by Dr. Pietro for evaluation of CHF.  Patient had no known cardiac history prior to 3/21.  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  Several weeks prior to 3/21 admission, she began to note exertional dyspnea.  This worsened to shortness of breath with minimal exertion and severe orthopnea.  She was admitted in 3/21 with dyspnea, CXR showed pulmonary edema and echo showed EF <20% with moderate LV dilation.  Low BP limited medication titration.  RHC/LHC showed no coronary disease, preserved cardiac output.  She was discharged home.    She says that she got hives when Entresto  was increased to 49/51 but resolved when Entresto  was decreased back to 24/26 bid.   Echo in 1/22 showed EF 30-35%, global hypokinesis. Cardiac MRI in 9/22 showed EF 54%, normal RV, no delayed enhancement.   Echo 5/23 showed EF 55-60%, normal RV  Echo 7/25 showed EF 60-65%, normal RV, IVC normal.   He returns today for heart failure follow up. Overall feeling ok, however has been having intermittent dizziness since yesterday. NYHA II, mainly from fatigue. Reports dizziness and lightheadedness, and mild atypical chest pain. Denies dyspnea, near-syncope, and palpitations. Able to perform ADLs. Appetite okay. Weight at home stable. Does not take BP at home. Was supposed to have stopped Lasix /KCL, however unsure if she did or not. At first stated that she has continued to take them, however later in visit says that she is not taking them.  PMH:  1. GERD 2. Chronic systolic CHF: Diagnosed 3/21.  Nonischemic cardiomyopathy.  - Echo (3/21): EF < 20%, moderate LV dilation, normal RV, mild-moderate pericardial effusion.  - RHC/LHC (4/21): No CAD; mean RA 6, PA 39/19, mean PCWP 17, CI 2.67 - Echo (1/22): EF 30-35%, global hypokinesis, normal RV,  trivial pericardial effusion.  - Echo (9/22): EF 54%, normal RV, no delayed enhancement. - Echo (5/23): EF 55-60%, normal RV - Echo (7/25): EF 60-65%, normal RV, IVC normal.   FH: Parents with MIs in their 70s, sister with enlarged heart.    SH: Married with 5 kids. No ETOH, drugs.  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   ROS: All systems reviewed and negative except as per HPI.   Current Outpatient Medications  Medication Sig Dispense Refill   atorvastatin  (LIPITOR) 40 MG tablet Take 1 tablet (40 mg total) by mouth daily. 90 tablet 3   dapagliflozin  propanediol (FARXIGA ) 10 MG TABS tablet Take 1 tablet (10 mg total) by mouth daily before breakfast. 90 tablet 3   famotidine  (PEPCID ) 20 MG tablet Take 1 tablet (20 mg total) by mouth 2 (two) times daily. 180 tablet 3   furosemide  (LASIX ) 20 MG tablet Take 1 tablet (20 mg total) by mouth as needed. Weight gain of 3lb in 24 hours or 5 lb in a week     metoprolol  succinate (TOPROL -XL) 100 MG 24 hr tablet Take 1 tablet (100 mg total) by mouth 2 (two) times daily. 180 tablet 3   Multiple Vitamins-Minerals (MULTIVITAMIN WITH MINERALS) tablet Take 1 tablet by mouth daily.     pantoprazole  (PROTONIX ) 40 MG tablet Take 1 tablet (40 mg total) by mouth daily. 90 tablet 1   potassium chloride  (KLOR-CON  M10) 10 MEQ tablet Take 1 tablet (10 mEq total) by mouth daily as needed. 90 tablet 3  sacubitril -valsartan  (ENTRESTO ) 24-26 MG Take 1 tablet by mouth 2 (two) times daily. 180 tablet 1   spironolactone  (ALDACTONE ) 25 MG tablet Take 1 tablet (25 mg total) by mouth daily. 90 tablet 3   No current facility-administered medications for this encounter.   Wt Readings from Last 3 Encounters:  05/13/24 74.1 kg (163 lb 6.4 oz)  03/24/24 73.8 kg (162 lb 9.6 oz)  07/16/23 75.3 kg (166 lb)   BP 118/76   Pulse 89   Ht 5' 3 (1.6 m)   Wt 74.1 kg (163 lb 6.4 oz)   SpO2 97%   BMI 28.95 kg/m   Physical exam: General: Tired appearing.  No distress on RA Cardiac: JVP flat. S1 and S2 present. No murmurs or rub. Resp: Lung sounds clear and equal B/L Extremities: Warm and dry.  No peripheral edema.  Neuro: Alert and oriented x3. Affect pleasant.   ECG (personally reviewed): NSR 84 bpm  Assessment/Plan: 1. Chronic systolic CHF: Nonischemic cardiomyopathy.  Echo in 3/21 with EF < 20%, moderate LV dilation.  LHC/RHC in 4/21 with no CAD, preserved cardiac output.  Etiology is uncertain, she does not use ETOH/drugs, no definite family history. Possible viral myocarditis. Echo in 1/22 with EF higher at 30-35%.  Cardiac MRI in 9/22 showed EF up to 54%, no delayed enhancement.  Echo 5/23 EF 55-60%, normal RV.  Echo in 7/25 showed EF 60-65%, normal RV, IVC normal. NYHA II, due to fatigue. Euvolemic.   - Decrease Toprol  XL to 50 mg bid due to dizziness  - Continue spironolactone  25 mg daily. BMET/BNP today - Continue Entresto  24/26 bid (unable to tolerate higher doses) - Continue dapagliflozin  10 mg daily.  - Was supposed to have stopped Lasix /KCL, at first stated she was taking them everyday because she was scared of getting fluid overloaded, however later in the visit stated that  2. Hyperlipidemia: LDL 111 on last check. Increase atorvastatin  to 80 mg daily.  LFTs/Lipid panel in 2 months. Follow up with APP in 6 months.   [place] Kymari Nuon, NP 05/13/24

## 2024-05-14 ENCOUNTER — Ambulatory Visit (HOSPITAL_COMMUNITY): Payer: Self-pay | Admitting: Cardiology

## 2024-07-13 ENCOUNTER — Other Ambulatory Visit (HOSPITAL_COMMUNITY)

## 2024-07-23 ENCOUNTER — Ambulatory Visit (HOSPITAL_COMMUNITY)
Admission: RE | Admit: 2024-07-23 | Discharge: 2024-07-23 | Disposition: A | Discharge status: Home / Self Care | Source: Ambulatory Visit | Attending: Cardiology | Admitting: Cardiology

## 2024-07-23 DIAGNOSIS — I5022 Chronic systolic (congestive) heart failure: Secondary | ICD-10-CM | POA: Diagnosis not present

## 2024-07-23 LAB — HEPATIC FUNCTION PANEL
ALT: 14 U/L (ref 0–44)
AST: 18 U/L (ref 15–41)
Albumin: 3.3 g/dL — ABNORMAL LOW (ref 3.5–5.0)
Alkaline Phosphatase: 69 U/L (ref 38–126)
Bilirubin, Direct: <0.1 mg/dL (ref 0.0–0.2)
Total Bilirubin: 0.5 mg/dL (ref 0.0–1.2)
Total Protein: 7.6 g/dL (ref 6.5–8.1)

## 2024-07-23 LAB — LIPID PANEL
Cholesterol: 178 mg/dL (ref 0–200)
HDL: 49 mg/dL (ref >40)
LDL Cholesterol: 113 mg/dL — ABNORMAL HIGH (ref 0–99)
Total CHOL/HDL Ratio: 3.6 ratio
Triglycerides: 79 mg/dL (ref <150)
VLDL: 16 mg/dL (ref 0–40)

## 2024-08-08 ENCOUNTER — Other Ambulatory Visit (HOSPITAL_COMMUNITY): Payer: Self-pay | Admitting: Cardiology

## 2024-08-09 ENCOUNTER — Other Ambulatory Visit (HOSPITAL_COMMUNITY): Payer: Self-pay | Admitting: Cardiology

## 2024-10-05 ENCOUNTER — Encounter (HOSPITAL_BASED_OUTPATIENT_CLINIC_OR_DEPARTMENT_OTHER): Payer: Self-pay

## 2024-10-05 ENCOUNTER — Emergency Department (HOSPITAL_BASED_OUTPATIENT_CLINIC_OR_DEPARTMENT_OTHER)
Admission: EM | Admit: 2024-10-05 | Discharge: 2024-10-05 | Disposition: A | Discharge status: Home / Self Care | Attending: Emergency Medicine | Admitting: Emergency Medicine

## 2024-10-05 ENCOUNTER — Emergency Department (HOSPITAL_BASED_OUTPATIENT_CLINIC_OR_DEPARTMENT_OTHER)

## 2024-10-05 ENCOUNTER — Other Ambulatory Visit: Payer: Self-pay

## 2024-10-05 DIAGNOSIS — K219 Gastro-esophageal reflux disease without esophagitis: Secondary | ICD-10-CM | POA: Insufficient documentation

## 2024-10-05 DIAGNOSIS — I509 Heart failure, unspecified: Secondary | ICD-10-CM | POA: Insufficient documentation

## 2024-10-05 DIAGNOSIS — R0789 Other chest pain: Secondary | ICD-10-CM

## 2024-10-05 LAB — CBC
HCT: 39.3 % (ref 36.0–46.0)
Hemoglobin: 12.8 g/dL (ref 12.0–15.0)
MCH: 29.8 pg (ref 26.0–34.0)
MCHC: 32.6 g/dL (ref 30.0–36.0)
MCV: 91.6 fL (ref 80.0–100.0)
Platelets: 292 10*3/uL (ref 150–400)
RBC: 4.29 MIL/uL (ref 3.87–5.11)
RDW: 14.7 % (ref 11.5–15.5)
WBC: 4.2 10*3/uL (ref 4.0–10.5)
nRBC: 0 % (ref 0.0–0.2)

## 2024-10-05 LAB — BASIC METABOLIC PANEL WITH GFR
Anion gap: 10 (ref 5–15)
BUN: 11 mg/dL (ref 8–23)
CO2: 24 mmol/L (ref 22–32)
Calcium: 9.5 mg/dL (ref 8.9–10.3)
Chloride: 102 mmol/L (ref 98–111)
Creatinine, Ser: 0.62 mg/dL (ref 0.44–1.00)
GFR, Estimated: >60 mL/min
Glucose, Bld: 105 mg/dL — ABNORMAL HIGH (ref 70–99)
Potassium: 3.7 mmol/L (ref 3.5–5.1)
Sodium: 137 mmol/L (ref 135–145)

## 2024-10-05 LAB — TROPONIN T, HIGH SENSITIVITY
Troponin T High Sensitivity: <6 ng/L (ref 0–19)
Troponin T High Sensitivity: <6 ng/L (ref 0–19)

## 2024-10-05 MED ORDER — PANTOPRAZOLE SODIUM 40 MG IV SOLR
40.0000 mg | Freq: Once | INTRAVENOUS | Status: AC
  Administered 2024-10-05: 40 mg via INTRAVENOUS
  Filled 2024-10-05: qty 10

## 2024-10-05 MED ORDER — PANTOPRAZOLE SODIUM 20 MG PO TBEC: 20.0000 mg | DELAYED_RELEASE_TABLET | Freq: Every day | ORAL | 0 refills | Status: AC

## 2024-10-05 MED ORDER — ALUM & MAG HYDROXIDE-SIMETH 200-200-20 MG/5ML PO SUSP
15.0000 mL | Freq: Once | ORAL | Status: AC
  Administered 2024-10-05: 15 mL via ORAL
  Filled 2024-10-05: qty 30

## 2024-10-05 NOTE — ED Triage Notes (Signed)
 Pt POV with husband d/t central CP that began upon waking with lots of burping.

## 2024-10-05 NOTE — Discharge Instructions (Signed)
 You can begin taking the acid decreasing medication I prescribed once daily.  You can take it in the morning about 30 minutes before you eat breakfast.  Please follow-up with your primary care doctor.  Please return to the emergency department if you have any return of chest pain, shortness of breath.

## 2024-11-10 ENCOUNTER — Encounter (HOSPITAL_COMMUNITY)
